# Patient Record
Sex: Female | Born: 1990 | Race: Black or African American | Hispanic: No | Marital: Single | Smoking: Never smoker
Health system: Southern US, Community
[De-identification: ages and names within clinical notes are randomized; demographics above are authoritative.]

## PROBLEM LIST (undated history)

## (undated) DIAGNOSIS — F419 Anxiety disorder, unspecified: Secondary | ICD-10-CM

## (undated) DIAGNOSIS — E669 Obesity, unspecified: Secondary | ICD-10-CM

## (undated) DIAGNOSIS — E282 Polycystic ovarian syndrome: Secondary | ICD-10-CM

## (undated) DIAGNOSIS — I10 Essential (primary) hypertension: Secondary | ICD-10-CM

## (undated) DIAGNOSIS — K219 Gastro-esophageal reflux disease without esophagitis: Secondary | ICD-10-CM

## (undated) DIAGNOSIS — E785 Hyperlipidemia, unspecified: Secondary | ICD-10-CM

## (undated) DIAGNOSIS — D649 Anemia, unspecified: Secondary | ICD-10-CM

## (undated) DIAGNOSIS — D573 Sickle-cell trait: Secondary | ICD-10-CM

## (undated) HISTORY — DX: Anemia, unspecified: D64.9

## (undated) HISTORY — DX: Hyperlipidemia, unspecified: E78.5

## (undated) HISTORY — DX: Anxiety disorder, unspecified: F41.9

## (undated) HISTORY — PX: OTHER SURGICAL HISTORY: SHX169

## (undated) HISTORY — DX: Obesity, unspecified: E66.9

---

## 2017-02-11 ENCOUNTER — Ambulatory Visit (HOSPITAL_COMMUNITY)
Admission: EM | Admit: 2017-02-11 | Discharge: 2017-02-11 | Disposition: A | Discharge status: Home / Self Care | Payer: 59 | Attending: Urgent Care | Admitting: Urgent Care

## 2017-02-11 ENCOUNTER — Encounter (HOSPITAL_COMMUNITY): Payer: Self-pay | Admitting: Emergency Medicine

## 2017-02-11 DIAGNOSIS — R51 Headache: Secondary | ICD-10-CM | POA: Diagnosis not present

## 2017-02-11 DIAGNOSIS — R112 Nausea with vomiting, unspecified: Secondary | ICD-10-CM

## 2017-02-11 DIAGNOSIS — R519 Headache, unspecified: Secondary | ICD-10-CM

## 2017-02-11 DIAGNOSIS — Z9109 Other allergy status, other than to drugs and biological substances: Secondary | ICD-10-CM

## 2017-02-11 MED ORDER — KETOROLAC TROMETHAMINE 60 MG/2ML IM SOLN
INTRAMUSCULAR | Status: AC
  Filled 2017-02-11: qty 2

## 2017-02-11 MED ORDER — KETOROLAC TROMETHAMINE 60 MG/2ML IM SOLN
60.0000 mg | Freq: Once | INTRAMUSCULAR | Status: AC
  Administered 2017-02-11: 60 mg via INTRAMUSCULAR

## 2017-02-11 MED ORDER — ONDANSETRON 8 MG PO TBDP: 8.0000 mg | ORAL_TABLET | Freq: Three times a day (TID) | ORAL | 5 refills | Status: DC | PRN

## 2017-02-11 NOTE — ED Provider Notes (Signed)
  MRN: [id number] DOB: Jun 08, 1990  Subjective:   Christina Bailey is a 26 y.o. female presenting for chief complaint of Headache  Reports 2 week history of intermittent sharp left-sided frontal headache with associated nausea with vomiting (2 episodes), occasional blurred vision. Admits that she feels dizziness when the headaches are severe. Has tried otc ibuprofen, APAP. Also has difficulties with allergies, sinus congestion. Does not take allergy medications. Denies fever, photophobia, phonophobia, confusion, weakness, numbness or tingling. Does not hydrate very well. Denies smoking cigarettes. Has occasional alcohol drink. Sleeps ~6 hours of sleep, feels rested.  No current facility-administered medications for this encounter.    Current Outpatient Prescriptions  Medication Sig Dispense Refill  . acetaminophen (TYLENOL) 325 MG tablet Take 650 mg by mouth every 6 (six) hours as needed.      Christina Bailey has No Known Allergies. Also denies past medical and surgical history.   Objective:   Vitals: BP 140/86 (BP Location: Left Arm)   Pulse 86   Temp 98.4 F (36.9 C) (Oral)   Resp 18   LMP 01/28/2017 (Exact Date)   SpO2 100%   Physical Exam  Constitutional: She is oriented to person, place, and time. She appears well-developed and well-nourished.  HENT:  TM's intact bilaterally, no effusions or erythema. Nasal turbinates dry and erythematous, nasal passages patent. No sinus tenderness. Oropharynx with mild post-nasal drainage, mucous membranes moist.  Eyes: Pupils are equal, round, and reactive to light. EOM are normal. Right eye exhibits no discharge. Left eye exhibits no discharge.  Neck: Normal range of motion. Neck supple.  Cardiovascular: Normal rate, regular rhythm and intact distal pulses.  Exam reveals no gallop and no friction rub.   No murmur heard. Pulmonary/Chest: No respiratory distress. She has no wheezes. She has no rales.  Lymphadenopathy:    She has no cervical  adenopathy.  Neurological: She is alert and oriented to person, place, and time. No cranial nerve deficit. Coordination normal.  Skin: Skin is warm and dry.   Assessment and Plan :   Acute nonintractable headache, unspecified headache type  Environmental allergies  Nausea and vomiting, intractability of vomiting not specified, unspecified vomiting type  Recommended conservative management of headache. Return-to-clinic precautions discussed, patient verbalized understanding.   Wallis BambergMario Demontray Franta, PA-C [facility]  02/11/2017  4:44 PM    Wallis BambergMani, Ricci Paff, PA-C 02/14/17 2115

## 2017-02-11 NOTE — ED Triage Notes (Signed)
[facility] a [facility] headache with nausea x 2 weeks.

## 2017-02-14 ENCOUNTER — Encounter (HOSPITAL_COMMUNITY): Payer: Self-pay | Admitting: Urgent Care

## 2017-03-16 ENCOUNTER — Emergency Department (HOSPITAL_COMMUNITY): Admission: EM | Admit: 2017-03-16 | Discharge: 2017-03-17 | Discharge status: Against Medical Advice | Payer: 59

## 2017-03-16 NOTE — ED Notes (Signed)
Pt called for v/s, no response from lobby 

## 2017-03-16 NOTE — ED Triage Notes (Signed)
No response in lobby for triage.  

## 2017-06-26 ENCOUNTER — Encounter (HOSPITAL_COMMUNITY): Payer: Self-pay | Admitting: Emergency Medicine

## 2017-06-26 ENCOUNTER — Ambulatory Visit (HOSPITAL_COMMUNITY)
Admission: EM | Admit: 2017-06-26 | Discharge: 2017-06-26 | Disposition: A | Discharge status: Home / Self Care | Payer: Managed Care, Other (non HMO) | Attending: Family Medicine | Admitting: Family Medicine

## 2017-06-26 DIAGNOSIS — Z3202 Encounter for pregnancy test, result negative: Secondary | ICD-10-CM

## 2017-06-26 DIAGNOSIS — K529 Noninfective gastroenteritis and colitis, unspecified: Secondary | ICD-10-CM

## 2017-06-26 DIAGNOSIS — R109 Unspecified abdominal pain: Secondary | ICD-10-CM | POA: Diagnosis not present

## 2017-06-26 DIAGNOSIS — K219 Gastro-esophageal reflux disease without esophagitis: Secondary | ICD-10-CM

## 2017-06-26 LAB — POCT URINALYSIS DIP (DEVICE)
Bilirubin Urine: NEGATIVE
GLUCOSE, UA: NEGATIVE mg/dL
Ketones, ur: NEGATIVE mg/dL
LEUKOCYTES UA: NEGATIVE
NITRITE: NEGATIVE
Protein, ur: NEGATIVE mg/dL
Specific Gravity, Urine: 1.025 (ref 1.005–1.030)
UROBILINOGEN UA: 0.2 mg/dL (ref 0.0–1.0)
pH: 6 (ref 5.0–8.0)

## 2017-06-26 LAB — POCT PREGNANCY, URINE: PREG TEST UR: NEGATIVE

## 2017-06-26 MED ORDER — ONDANSETRON 4 MG PO TBDP
4.0000 mg | ORAL_TABLET | Freq: Once | ORAL | Status: AC
  Administered 2017-06-26: 4 mg via ORAL

## 2017-06-26 MED ORDER — ONDANSETRON 4 MG PO TBDP
ORAL_TABLET | ORAL | Status: AC
  Filled 2017-06-26: qty 1

## 2017-06-26 MED ORDER — GI COCKTAIL ~~LOC~~
30.0000 mL | Freq: Once | ORAL | Status: AC
  Administered 2017-06-26: 30 mL via ORAL

## 2017-06-26 MED ORDER — OMEPRAZOLE 20 MG PO CPDR: 20.0000 mg | DELAYED_RELEASE_CAPSULE | Freq: Every day | ORAL | 0 refills | Status: DC

## 2017-06-26 MED ORDER — GI COCKTAIL ~~LOC~~
ORAL | Status: AC
  Filled 2017-06-26: qty 30

## 2017-06-26 MED ORDER — ONDANSETRON HCL 4 MG PO TABS: 4.0000 mg | ORAL_TABLET | Freq: Three times a day (TID) | ORAL | 0 refills | Status: DC | PRN

## 2017-06-26 NOTE — Discharge Instructions (Signed)
Push fluids to ensure adequate hydration and keep secretions thin.  Liquid diet, small frequent sips until pain and nausea have improved. Advance to bland diet as tolerated, toast, crackers, rice etc. Tylenol as needed. Start daily omeprazole. Zofran every 8 hours as needed for nausea. If develop increased pain, vomiting, no urine output in 8 hour time period, dizziness or weakness, or otherwise worsening return to be seen or go to Er. Please establish with a primary care provider.

## 2017-06-26 NOTE — ED Provider Notes (Signed)
[facility]    [name]: [id number] Arrival date & time: 06/26/17  1453     History   Chief Complaint Chief Complaint  Patient presents with  . Abdominal Pain    HPI Christina Bailey is a 27 y.o. female.   Christina Bailey presents with complaints of abdominal pain, nausea, vomiting and diarrhea which started yesterday. She vomited three times yesterday and twice today. States it is green and yellow bile. Today she has had three episodes of diarrhea. She states she has had three days of diarrhea, approximately 3-4 times a day. Feels like she has burning up her throat. Pain is sharp and cramping to middle of her abdomin. States she has not eating or drank fluids today. Decreased urination. No known ill contacts. Denies fevers. No known contaminated or questionable food intake. Denies urinary or vaginal symptoms. History of reflux, does not take medication for this. Denies any other medical history. LMP 1/15.   ROS per HPI.       History reviewed. No pertinent past medical history.  There are no active problems to display for this patient.   History reviewed. No pertinent surgical history.  OB History    No data available       Home Medications    Prior to Admission medications   Medication Sig Start Date End Date Taking? Authorizing Provider  acetaminophen (TYLENOL) 325 MG tablet Take 650 mg by mouth every 6 (six) hours as needed.    [provider]  omeprazole (PRILOSEC) 20 MG capsule Take 1 capsule (20 mg total) by mouth daily. 06/26/17   Georgetta HaberBurky, Corah Willeford B, NP  ondansetron (ZOFRAN) 4 MG tablet Take 1 tablet (4 mg total) by mouth every 8 (eight) hours as needed for nausea or vomiting. 06/26/17   Georgetta HaberBurky, Tavious Griesinger B, NP    Family History No family history on file.  Social History Social History   Tobacco Use  . Smoking status: Never Smoker  . Smokeless tobacco: Never Used  Substance Use Topics  . Alcohol use: Yes  . Drug use: No     Allergies     Patient has no known allergies.   Review of Systems Review of Systems   Physical Exam Triage Vital Signs ED Triage Vitals  Enc Vitals Group     BP 06/26/17 1515 129/75     Pulse Rate 06/26/17 1515 (!) 130     Resp 06/26/17 1515 20     Temp 06/26/17 1515 99.4 F (37.4 C)     Temp Source 06/26/17 1515 Oral     SpO2 06/26/17 1515 100 %     Weight --      Height --      Head Circumference --      Peak Flow --      Pain Score 06/26/17 1516 8     Pain Loc --      Pain Edu? --      Excl. in GC? --    No data found.  Updated Vital Signs BP 129/75 (BP Location: Right Arm)   Pulse (!) 130 Comment: Notified Tina Goss  Temp 99.4 F (37.4 C) (Oral)   Resp 20   LMP 06/19/2017   SpO2 100%   Visual Acuity Right Eye Distance:   Left Eye Distance:   Bilateral Distance:    Right Eye Near:   Left Eye Near:    Bilateral Near:     Physical Exam  Constitutional: She is oriented to person, place, and time.  She appears well-developed and well-nourished. No distress.  HENT:  Head: Normocephalic.  Eyes: Pupils are equal, round, and reactive to light.  Cardiovascular: Regular rhythm and normal heart sounds. Tachycardia present.  Pulmonary/Chest: Effort normal and breath sounds normal.  Abdominal: Soft. There is tenderness in the epigastric area and periumbilical area. There is no rigidity, no rebound, no guarding, no CVA tenderness, no tenderness at McBurney's point and negative Murphy's sign.  Neurological: She is alert and oriented to person, place, and time.  Skin: Skin is warm and dry.     UC Treatments / Results  Labs (all labs ordered are listed, but only abnormal results are displayed) Labs Reviewed  POCT URINALYSIS DIP (DEVICE) - Abnormal; Notable for the following components:      Result Value   Hgb urine dipstick SMALL (*)    All other components within normal limits  POCT PREGNANCY, URINE    EKG  EKG Interpretation None       Radiology No results  found.  Procedures Procedures (including critical care time)  Medications Ordered in UC Medications  ondansetron (ZOFRAN-ODT) disintegrating tablet 4 mg (4 mg Oral Given 06/26/17 1545)  gi cocktail (Maalox,Lidocaine,Donnatal) (30 mLs Oral Given 06/26/17 1545)     Initial Impression / Assessment and Plan / UC Course  I have reviewed the triage vital signs and the nursing notes.  Pertinent labs & imaging results that were available during my care of the patient were reviewed by me and considered in my medical decision making (see chart for details).  Clinical Course as of Jun 26 1634  Tue Jun 26, 2017  1606 Pain 8/10, taking ginger ale  [NB]  1636 Hr 124  [NB]    Clinical Course User Index [NB] Linus Mako B, NP    History and physical exam consistent with gastroenteritis exacerbating gerd symptoms, with vomiting and diarrhea and upper abdominal pain and burning to throat. zofran as needed. Tolerating fluids in clinic. Bland diet as tolerated. Increase fluids. Tachycardia noted at this time, mild improvement after drinking a ginger ale in clinic. Return precautions provided. Patient verbalized understanding and agreeable to plan.    Final Clinical Impressions(s) / UC Diagnoses   Final diagnoses:  Gastroenteritis  Gastroesophageal reflux disease, esophagitis presence not specified    ED Discharge Orders        Ordered    ondansetron (ZOFRAN) 4 MG tablet  Every 8 hours PRN     06/26/17 1619    omeprazole (PRILOSEC) 20 MG capsule  Daily     06/26/17 1619       Controlled Substance Prescriptions [place] Controlled Substance Registry consulted? Not Applicable   Georgetta Haber, NP 06/26/17 1637

## 2017-06-26 NOTE — ED Notes (Signed)
Pt given ginger ale.

## 2017-06-26 NOTE — ED Triage Notes (Signed)
Pt c/o n/v/d since yesterday. Pt c/o abdominal pain generalized.

## 2017-09-08 ENCOUNTER — Emergency Department (HOSPITAL_COMMUNITY)
Admission: EM | Admit: 2017-09-08 | Discharge: 2017-09-08 | Disposition: A | Discharge status: Home / Self Care | Payer: Managed Care, Other (non HMO) | Attending: Emergency Medicine | Admitting: Emergency Medicine

## 2017-09-08 ENCOUNTER — Other Ambulatory Visit: Payer: Self-pay

## 2017-09-08 ENCOUNTER — Encounter (HOSPITAL_COMMUNITY): Payer: Self-pay

## 2017-09-08 DIAGNOSIS — K2100 Gastro-esophageal reflux disease with esophagitis, without bleeding: Secondary | ICD-10-CM

## 2017-09-08 DIAGNOSIS — R1013 Epigastric pain: Secondary | ICD-10-CM | POA: Diagnosis present

## 2017-09-08 HISTORY — DX: Gastro-esophageal reflux disease without esophagitis: K21.9

## 2017-09-08 MED ORDER — OMEPRAZOLE 20 MG PO CPDR: 20.0000 mg | DELAYED_RELEASE_CAPSULE | Freq: Every day | ORAL | 3 refills | Status: DC

## 2017-09-08 MED ORDER — GI COCKTAIL ~~LOC~~
30.0000 mL | Freq: Once | ORAL | Status: AC
  Administered 2017-09-08: 30 mL via ORAL
  Filled 2017-09-08: qty 30

## 2017-09-08 NOTE — ED Triage Notes (Addendum)
Patient reports that she has been noncompliant with acid reflux pills and is now having severe GERD and abdominal pain.

## 2017-09-08 NOTE — ED Notes (Signed)
Bed: [id number] Expected date: 09/08/17 Expected time: 9:19 AM Means of arrival: Ambulance Comments: [facility] VSS

## 2017-09-08 NOTE — ED Provider Notes (Signed)
[facility]-EMERGENCY DEPT Provider Note   [name]: [id number] Arrival date & time: 09/08/17  0856     History   Chief Complaint Chief Complaint  Patient presents with  . Gastroesophageal Reflux  . Abdominal Pain    HPI Christina Bailey is a 27 y.o. female.  Patient is a 27 year old female with a prior history of GERD who presents today with worsening abdominal discomfort, burning chest pain, asked that he taste in the back of her mouth, worse with lying down and with exercise.  She states all of her symptoms have been worse this week.  She was seen in January and was diagnosed with GERD.  At that time she was given Prilosec which she did start taking regularly and finished her course about 2 weeks ago and has not taken anything since that time.  She denies any fever, bowel changes.  She does not drink alcohol excessively does not use tobacco products and is not taking NSAIDs.  She does admit to gaining 70-80 pounds in the last year related to depression and is not sought any counseling for this.  She states she is trying to get back to the gym but when being active it seems to make the reflux worse.  She has stopped eating all spicy foods and is currently only eating 2 times a day.  She tries to not eat large meals at a time.  The history is provided by the patient.  Gastroesophageal Reflux  This is a recurrent problem. Episode onset: ongoing for months but worse in the last 1 week. The problem occurs constantly. The problem has been gradually worsening. Associated symptoms include abdominal pain. Associated symptoms comments: Nausea and vomiting. Exacerbated by: exercise, lying down, spicy foods. Relieved by: sitting up, prilosec.  Abdominal Pain   Her past medical history is significant for GERD.    Past Medical History:  Diagnosis Date  . GERD (gastroesophageal reflux disease)     There are no active problems to display for this patient.   History reviewed. No  pertinent surgical history.   OB History   None      Home Medications    Prior to Admission medications   Medication Sig Start Date End Date Taking? Authorizing Provider  acetaminophen (TYLENOL) 325 MG tablet Take 650 mg by mouth every 6 (six) hours as needed.    [provider]  omeprazole (PRILOSEC) 20 MG capsule Take 1 capsule (20 mg total) by mouth daily. Patient not taking: Reported on 09/08/2017 06/26/17   Linus Mako B, NP  ondansetron (ZOFRAN) 4 MG tablet Take 1 tablet (4 mg total) by mouth every 8 (eight) hours as needed for nausea or vomiting. Patient not taking: Reported on 09/08/2017 06/26/17   Georgetta Haber, NP    Family History History reviewed. No pertinent family history.  Social History Social History   Tobacco Use  . Smoking status: Never Smoker  . Smokeless tobacco: Never Used  Substance Use Topics  . Alcohol use: Yes  . Drug use: No     Allergies   Patient has no known allergies.   Review of Systems Review of Systems  Gastrointestinal: Positive for abdominal pain.  All other systems reviewed and are negative.    Physical Exam Updated Vital Signs BP (!) 170/107 (BP Location: Left Arm)   Pulse 99   Temp 98.4 F (36.9 C) (Oral)   Resp 18   Ht 5\' 5"  (1.651 m)   Wt 123.4 kg (272 lb)  LMP 09/08/2017   SpO2 100%   BMI 45.26 kg/m   Physical Exam  Constitutional: She is oriented to person, place, and time. She appears well-developed and well-nourished. No distress.  obese  HENT:  Head: Normocephalic and atraumatic.  Mouth/Throat: Oropharynx is clear and moist.  Eyes: Pupils are equal, round, and reactive to light. Conjunctivae and EOM are normal.  Neck: Normal range of motion. Neck supple.  Cardiovascular: Normal rate, regular rhythm and intact distal pulses.  No murmur heard. Pulmonary/Chest: Effort normal and breath sounds normal. No respiratory distress. She has no wheezes. She has no rales.  Abdominal: Soft. She exhibits  no distension. There is tenderness in the epigastric area. There is no rebound and no guarding.  Musculoskeletal: Normal range of motion. She exhibits no edema or tenderness.  Neurological: She is alert and oriented to person, place, and time.  Skin: Skin is warm and dry. No rash noted. No erythema.  Psychiatric: She has a normal mood and affect. Her behavior is normal.  Nursing note and vitals reviewed.    ED Treatments / Results  Labs (all labs ordered are listed, but only abnormal results are displayed) Labs Reviewed - No data to display  EKG None  Radiology No results found.  Procedures Procedures (including critical care time)  Medications Ordered in ED Medications  gi cocktail (Maalox,Lidocaine,Donnatal) (has no administration in time range)     Initial Impression / Assessment and Plan / ED Course  I have reviewed the triage vital signs and the nursing notes.  Pertinent labs & imaging results that were available during my care of the patient were reviewed by me and considered in my medical decision making (see chart for details).     Patient is a well-appearing pleasant female presenting with symptoms classic for reflux.  Symptoms had improved with taking Prilosec but she had stopped taking that approximately 2 weeks ago and now her symptoms are much worse.  She has tried some lifestyle changes such as avoiding spicy foods and not eating directly before going to bed.  However discussed with her elevating the head of her bed, eating more frequent and small meals, weight loss and exercise.  Also encouraged her to seek treatment for depression which is most likely worsening her symptoms.  Low suspicion at this time for cholecystitis, cardiac pathology, diabetes, perforated ulcers, obstruction or pancreatitis. Patient given a GI cocktail.  Repeat vital signs improved.   Vitals:   09/08/17 0904 09/08/17 0949  BP: (!) 170/107 (!) 147/100  Pulse: 99 98  Resp: 18 18  Temp:  98.4 F (36.9 C)   SpO2: 100% 100%     Final Clinical Impressions(s) / ED Diagnoses   Final diagnoses:  Gastroesophageal reflux disease with esophagitis    ED Discharge Orders        Ordered    omeprazole (PRILOSEC) 20 MG capsule  Daily     09/08/17 0950       Gwyneth SproutPlunkett, Omarri Eich, MD 09/08/17 1004

## 2017-09-30 ENCOUNTER — Encounter (HOSPITAL_COMMUNITY): Payer: Self-pay | Admitting: *Deleted

## 2017-09-30 ENCOUNTER — Other Ambulatory Visit: Payer: Self-pay

## 2017-09-30 ENCOUNTER — Ambulatory Visit (HOSPITAL_COMMUNITY)
Admission: EM | Admit: 2017-09-30 | Discharge: 2017-09-30 | Disposition: A | Discharge status: Home / Self Care | Payer: Managed Care, Other (non HMO) | Attending: Urgent Care | Admitting: Urgent Care

## 2017-09-30 DIAGNOSIS — G44209 Tension-type headache, unspecified, not intractable: Secondary | ICD-10-CM

## 2017-09-30 DIAGNOSIS — R0789 Other chest pain: Secondary | ICD-10-CM

## 2017-09-30 DIAGNOSIS — J069 Acute upper respiratory infection, unspecified: Secondary | ICD-10-CM

## 2017-09-30 DIAGNOSIS — B9789 Other viral agents as the cause of diseases classified elsewhere: Secondary | ICD-10-CM | POA: Diagnosis not present

## 2017-09-30 HISTORY — DX: Sickle-cell trait: D57.3

## 2017-09-30 MED ORDER — BENZONATATE 100 MG PO CAPS: 100.0000 mg | ORAL_CAPSULE | Freq: Three times a day (TID) | ORAL | 0 refills | Status: DC | PRN

## 2017-09-30 MED ORDER — CETIRIZINE HCL 10 MG PO TABS: 10.0000 mg | ORAL_TABLET | Freq: Every day | ORAL | 11 refills | Status: DC

## 2017-09-30 MED ORDER — HYDROCODONE-HOMATROPINE 5-1.5 MG/5ML PO SYRP: 5.0000 mL | ORAL_SOLUTION | Freq: Every evening | ORAL | 0 refills | Status: DC | PRN

## 2017-09-30 NOTE — ED Triage Notes (Signed)
C/O waking up with "migraine", dizziness, HA, cough.  Decreased appetite.

## 2017-09-30 NOTE — ED Provider Notes (Signed)
  MRN: [id number] DOB: 10-21-1990  Subjective:   Christina Bailey is a 27 y.o. female presenting for acute onset today of temporal headache, cough that elicits chest pain over mid to upper left chest.  Patient overall feels general malaise, decreased appetite, nausea, dizziness.  She has not tried any medications for relief.  Admits a history of allergies.  Denies smoking cigarettes.  Denies history of blood clots.  Denies fever, confusion, visual disturbance, photophobia, numbness or tingling, weakness, shortness of breath, wheezing, belly pain, rashes.  Denies history of migraines.   No current facility-administered medications for this encounter.   Current Outpatient Medications:  .  acetaminophen (TYLENOL) 325 MG tablet, Take 650 mg by mouth every 6 (six) hours as needed., Disp: , Rfl:  .  omeprazole (PRILOSEC) 20 MG capsule, Take 1 capsule (20 mg total) by mouth daily., Disp: 30 capsule, Rfl: 3    No Known Allergies   Past Medical History:  Diagnosis Date  . GERD (gastroesophageal reflux disease)   . Sickle cell trait (HCC)      Denies past surgical history.   Objective:   Vitals: BP 133/80   Pulse 100   Temp 97.9 F (36.6 C) (Oral)   Resp (!) 22   LMP 09/29/2017 (Approximate) Comment: irregular  SpO2 100%   Physical Exam  Constitutional: She is oriented to person, place, and time. She appears well-developed and well-nourished.  HENT:  Mucous membranes dry.  Nasal mucosa dry and inflamed.  TMs clear bilaterally.  Eyes: Pupils are equal, round, and reactive to light. EOM are normal. Right eye exhibits no discharge. Left eye exhibits no discharge. No scleral icterus.  Neck: Normal range of motion. Neck supple.  Cardiovascular: Normal rate, regular rhythm and intact distal pulses. Exam reveals no gallop and no friction rub.  No murmur heard. Pulmonary/Chest: No respiratory distress. She has no wheezes. She has no rales.  Lymphadenopathy:    She has no cervical  adenopathy.  Neurological: She is alert and oriented to person, place, and time. No cranial nerve deficit.  Skin: Skin is warm and dry.  Psychiatric: She has a normal mood and affect.    Assessment and Plan :   Viral URI with cough  Atypical chest pain  Tension headache  We will manage patient's symptoms supportively.  Use Tessalon capsules, Hycodan cough syrup for cough suppression.  Counseled patient on adequate hydration.  Use Zyrtec for allergies. Counseled patient on potential for adverse effects with medications prescribed today, patient verbalized understanding. Return-to-clinic precautions discussed, patient verbalized understanding.    Wallis Bamberg, [place] 09/30/17 1824

## 2017-10-21 ENCOUNTER — Emergency Department (HOSPITAL_COMMUNITY): Payer: Managed Care, Other (non HMO)

## 2017-10-21 ENCOUNTER — Encounter (HOSPITAL_COMMUNITY): Payer: Self-pay | Admitting: Emergency Medicine

## 2017-10-21 ENCOUNTER — Emergency Department (HOSPITAL_COMMUNITY)
Admission: EM | Admit: 2017-10-21 | Discharge: 2017-10-21 | Disposition: A | Discharge status: Home / Self Care | Payer: Managed Care, Other (non HMO) | Attending: Emergency Medicine | Admitting: Emergency Medicine

## 2017-10-21 DIAGNOSIS — Z5321 Procedure and treatment not carried out due to patient leaving prior to being seen by health care provider: Secondary | ICD-10-CM | POA: Diagnosis not present

## 2017-10-21 DIAGNOSIS — R079 Chest pain, unspecified: Secondary | ICD-10-CM | POA: Insufficient documentation

## 2017-10-21 LAB — BASIC METABOLIC PANEL
Anion gap: 10 (ref 5–15)
BUN: 11 mg/dL (ref 6–20)
CALCIUM: 8.8 mg/dL — AB (ref 8.9–10.3)
CO2: 24 mmol/L (ref 22–32)
CREATININE: 0.77 mg/dL (ref 0.44–1.00)
Chloride: 105 mmol/L (ref 101–111)
Glucose, Bld: 128 mg/dL — ABNORMAL HIGH (ref 65–99)
Potassium: 3.7 mmol/L (ref 3.5–5.1)
SODIUM: 139 mmol/L (ref 135–145)

## 2017-10-21 LAB — CBC
HCT: 38.4 % (ref 36.0–46.0)
HEMOGLOBIN: 12 g/dL (ref 12.0–15.0)
MCH: 23 pg — AB (ref 26.0–34.0)
MCHC: 31.3 g/dL (ref 30.0–36.0)
MCV: 73.6 fL — ABNORMAL LOW (ref 78.0–100.0)
Platelets: 419 10*3/uL — ABNORMAL HIGH (ref 150–400)
RBC: 5.22 MIL/uL — ABNORMAL HIGH (ref 3.87–5.11)
RDW: 16.9 % — AB (ref 11.5–15.5)
WBC: 11.9 10*3/uL — AB (ref 4.0–10.5)

## 2017-10-21 LAB — I-STAT BETA HCG BLOOD, ED (MC, WL, AP ONLY): I-stat hCG, quantitative: <5 m[IU]/mL (ref <5)

## 2017-10-21 LAB — I-STAT TROPONIN, ED: TROPONIN I, POC: 0 ng/mL (ref 0.00–0.08)

## 2017-10-21 NOTE — ED Triage Notes (Signed)
Patient here from home with complaints of chest pain. Denies nausea, vomiting.

## 2017-10-21 NOTE — ED Notes (Signed)
Called Pt to be roomed, no response in lobby x2.

## 2017-10-21 NOTE — ED Notes (Signed)
Called Pt to be roomed, no response in lobby x1. 

## 2017-10-21 NOTE — ED Notes (Signed)
No answer from lobby  

## 2017-10-22 NOTE — ED Notes (Signed)
10/22/17    1140   Attempted follow up call  Number on chart is not for this pt   s Laelani Vasko rn

## 2017-10-27 ENCOUNTER — Emergency Department (HOSPITAL_COMMUNITY)
Admission: EM | Admit: 2017-10-27 | Discharge: 2017-10-27 | Disposition: A | Discharge status: Home / Self Care | Payer: Managed Care, Other (non HMO) | Attending: Emergency Medicine | Admitting: Emergency Medicine

## 2017-10-27 ENCOUNTER — Other Ambulatory Visit: Payer: Self-pay

## 2017-10-27 ENCOUNTER — Encounter (HOSPITAL_COMMUNITY): Payer: Self-pay

## 2017-10-27 ENCOUNTER — Emergency Department (HOSPITAL_COMMUNITY): Payer: Managed Care, Other (non HMO)

## 2017-10-27 DIAGNOSIS — Z79899 Other long term (current) drug therapy: Secondary | ICD-10-CM | POA: Diagnosis not present

## 2017-10-27 DIAGNOSIS — R0789 Other chest pain: Secondary | ICD-10-CM | POA: Diagnosis not present

## 2017-10-27 DIAGNOSIS — D573 Sickle-cell trait: Secondary | ICD-10-CM | POA: Diagnosis not present

## 2017-10-27 LAB — CBC WITH DIFFERENTIAL/PLATELET
BASOS PCT: 0 %
Basophils Absolute: 0 10*3/uL (ref 0.0–0.1)
EOS ABS: 0.2 10*3/uL (ref 0.0–0.7)
Eosinophils Relative: 1 %
HEMATOCRIT: 37.2 % (ref 36.0–46.0)
HEMOGLOBIN: 11.6 g/dL — AB (ref 12.0–15.0)
Lymphocytes Relative: 36 %
Lymphs Abs: 4.2 10*3/uL — ABNORMAL HIGH (ref 0.7–4.0)
MCH: 22.9 pg — ABNORMAL LOW (ref 26.0–34.0)
MCHC: 31.2 g/dL (ref 30.0–36.0)
MCV: 73.4 fL — ABNORMAL LOW (ref 78.0–100.0)
Monocytes Absolute: 0.5 10*3/uL (ref 0.1–1.0)
Monocytes Relative: 4 %
NEUTROS ABS: 6.8 10*3/uL (ref 1.7–7.7)
NEUTROS PCT: 59 %
Platelets: 391 10*3/uL (ref 150–400)
RBC: 5.07 MIL/uL (ref 3.87–5.11)
RDW: 16.9 % — ABNORMAL HIGH (ref 11.5–15.5)
WBC: 11.6 10*3/uL — AB (ref 4.0–10.5)

## 2017-10-27 LAB — I-STAT CHEM 8, ED
BUN: 7 mg/dL (ref 6–20)
Calcium, Ion: 1.08 mmol/L — ABNORMAL LOW (ref 1.15–1.40)
Chloride: 102 mmol/L (ref 101–111)
Creatinine, Ser: 0.8 mg/dL (ref 0.44–1.00)
Glucose, Bld: 125 mg/dL — ABNORMAL HIGH (ref 65–99)
HEMATOCRIT: 38 % (ref 36.0–46.0)
HEMOGLOBIN: 12.9 g/dL (ref 12.0–15.0)
POTASSIUM: 3.5 mmol/L (ref 3.5–5.1)
Sodium: 139 mmol/L (ref 135–145)
TCO2: 25 mmol/L (ref 22–32)

## 2017-10-27 LAB — I-STAT BETA HCG BLOOD, ED (MC, WL, AP ONLY)

## 2017-10-27 LAB — D-DIMER, QUANTITATIVE (NOT AT ARMC)

## 2017-10-27 MED ORDER — MELOXICAM 15 MG PO TABS: 15.0000 mg | ORAL_TABLET | Freq: Every day | ORAL | 0 refills | Status: DC

## 2017-10-27 NOTE — Discharge Instructions (Addendum)
Start taking mobic as prescribed for pain and inflammation for at least a week. No strenuous activity. Follow up with family doctor as needed.

## 2017-10-27 NOTE — ED Notes (Signed)
TWO UNSUCCESSFUL LAB COLLECTION ATTEMPTS 

## 2017-10-27 NOTE — ED Provider Notes (Signed)
[facility]-EMERGENCY DEPT Provider Note   [name]: [id number] Arrival date & time: 10/27/17  4098     History   Chief Complaint Chief Complaint  Patient presents with  . Chest Pain    HPI Christina Bailey is a 27 y.o. female.  HPI Christina Bailey is a 27 y.o. female history of acid reflux, presents to emergency department complaining of chest pain.  Patient states she has had sharp pain in her left side of the chest for almost a month.  She says she went to her primary care doctor and was told she had upper respiratory tract infection.  She states pain has not improved.  It is worse with movement and taking deep breaths.  She has had some shortness of breath but none at this time.  She denies any cough or congestion.  She has not tried any medications for this prior to coming in.  She denies any pain or swelling in extremities.  She does not smoke.  She does not take any birth control.  She denies being pregnant.  She has not had any recent travel or surgeries.  She states her brother has WPW.  She reports no prior cardiac or lung issues.  Past Medical History:  Diagnosis Date  . GERD (gastroesophageal reflux disease)   . Sickle cell trait (HCC)     There are no active problems to display for this patient.   No past surgical history on file.   OB History   None      Home Medications    Prior to Admission medications   Medication Sig Start Date End Date Taking? Authorizing Provider  acetaminophen (TYLENOL) 325 MG tablet Take 650 mg by mouth every 6 (six) hours as needed for moderate pain.     [provider]  benzonatate (TESSALON) 100 MG capsule Take 1-2 capsules (100-200 mg total) by mouth 3 (three) times daily as needed. Patient not taking: Reported on 10/21/2017 09/30/17   Wallis Bamberg, PA-C  cetirizine (ZYRTEC ALLERGY) 10 MG tablet Take 1 tablet (10 mg total) by mouth daily. Patient taking differently: Take 10 mg by mouth daily as needed for  allergies.  09/30/17   Wallis Bamberg, PA-C  HYDROcodone-homatropine [facility]) 5-1.5 MG/5ML syrup Take 5 mLs by mouth at bedtime as needed. Patient not taking: Reported on 10/21/2017 09/30/17   Wallis Bamberg, PA-C  omeprazole (PRILOSEC) 20 MG capsule Take 1 capsule (20 mg total) by mouth daily. 09/08/17   Gwyneth Sprout, MD    Family History Family History  Problem Relation Age of Onset  . Healthy Mother   . Sickle cell anemia Father     Social History Social History   Tobacco Use  . Smoking status: Never Smoker  . Smokeless tobacco: Never Used  Substance Use Topics  . Alcohol use: Not Currently  . Drug use: No     Allergies   Patient has no known allergies.   Review of Systems Review of Systems  Constitutional: Negative for chills and fever.  Respiratory: Positive for chest tightness and shortness of breath. Negative for cough.   Cardiovascular: Positive for chest pain. Negative for palpitations and leg swelling.  Gastrointestinal: Negative for abdominal pain, diarrhea, nausea and vomiting.  Genitourinary: Negative for dysuria, flank pain, pelvic pain, vaginal bleeding, vaginal discharge and vaginal pain.  Musculoskeletal: Negative for arthralgias, myalgias, neck pain and neck stiffness.  Skin: Negative for rash.  Neurological: Negative for dizziness, weakness and headaches.  All other systems reviewed and  are negative.    Physical Exam Updated Vital Signs BP (!) 150/85 (BP Location: Left Arm)   Pulse 80   Temp 97.9 F (36.6 C) (Oral)   Resp 18   LMP 09/29/2017 (Approximate) Comment: irregular  SpO2 99%   Physical Exam  Constitutional: She appears well-developed and well-nourished. No distress.  HENT:  Head: Normocephalic.  Eyes: Conjunctivae are normal.  Neck: Neck supple.  Cardiovascular: Normal rate, regular rhythm and normal heart sounds.  Pulmonary/Chest: Effort normal and breath sounds normal. No respiratory distress. She has no wheezes. She has no rales.  She exhibits no tenderness.  Abdominal: Soft. Bowel sounds are normal. She exhibits no distension. There is no tenderness. There is no rebound.  Musculoskeletal: She exhibits no edema.  Neurological: She is alert.  Skin: Skin is warm and dry.  Psychiatric: She has a normal mood and affect. Her behavior is normal.  Nursing note and vitals reviewed.    ED Treatments / Results  Labs (all labs ordered are listed, but only abnormal results are displayed) Labs Reviewed  CBC WITH DIFFERENTIAL/PLATELET - Abnormal; Notable for the following components:      Result Value   WBC 11.6 (*)    Hemoglobin 11.6 (*)    MCV 73.4 (*)    MCH 22.9 (*)    RDW 16.9 (*)    Lymphs Abs 4.2 (*)    All other components within normal limits  I-STAT CHEM 8, ED - Abnormal; Notable for the following components:   Glucose, Bld 125 (*)    Calcium, Ion 1.08 (*)    All other components within normal limits  D-DIMER, QUANTITATIVE (NOT AT [facility])  I-STAT BETA HCG BLOOD, ED ([facility], [facility], [facility] ONLY)    EKG EKG Interpretation  Date/Time:  Saturday Oct 27 2017 07:13:53 EDT Ventricular Rate:  88 PR Interval:    QRS Duration: 85 QT Interval:  383 QTC Calculation: 464 R Axis:   63 Text Interpretation:  Sinus rhythm Abnormal Q suggests inferior infarct Borderline repolarization abnormality Baseline wander in lead(s) II III aVF V5 No significant change since last tracing Confirmed by Doug Sou [phone]) on 10/27/2017 7:16:38 AM   Radiology Dg Chest 2 View  Result Date: 10/27/2017 CLINICAL DATA:  Chest pain. EXAM: CHEST - 2 VIEW COMPARISON:  Radiographs of Oct 21, 2017. FINDINGS: The heart size and mediastinal contours are within normal limits. Both lungs are clear. No pneumothorax or pleural effusion is noted. The visualized skeletal structures are unremarkable. IMPRESSION: No active cardiopulmonary disease. Electronically Signed   By: Lupita Raider, M.D.   On: 10/27/2017 08:36    Procedures Procedures (including  critical care time)  Medications Ordered in ED Medications - No data to display   Initial Impression / Assessment and Plan / ED Course  I have reviewed the triage vital signs and the nursing notes.  Pertinent labs & imaging results that were available during my care of the patient were reviewed by me and considered in my medical decision making (see chart for details).     Patient with somewhat pleuritic chest pain, ongoing for a month.  Her vital signs are normal.  She has no other explanation for her chest pain at this time, will get a d-dimer to rule out PE.  I cannot reproduce her pain with palpation of the chest wall.  Will also get a chest x-ray and basic labs.  Patient does not appear to be in any distress, she is nontoxic.  10:42 AM Patient's  labs are unremarkable other than slightly low hemoglobin 11.6, and slightly linear blood cell count of 11.6.  I do not think these findings are contributing to her chest pain.  Her d-dimer is negative.  She is not pregnant.  Chest x-ray is normal.  Most likely musculoskeletal pain.  Possibly pericarditis, although no ECG changes. Discussed results with patient, will start on NSAIDs.  Advised no strenuous lifting with upper body.  We will have her follow-up with family doctor.  Patient agreed to the plan.  Stable for discharge home.  Vitals:   10/27/17 1032 10/27/17 1035 10/27/17 1040 10/27/17 1046  BP:    (!) 143/81  Pulse:  81  68  Resp: (!) 26 (!) 22 20 (!) 23  Temp:  98.9 F (37.2 C)    TempSrc:  Oral    SpO2:  100%         Final Clinical Impressions(s) / ED Diagnoses   Final diagnoses:  Atypical chest pain    ED Discharge Orders        Ordered    meloxicam (MOBIC) 15 MG tablet  Daily     10/27/17 1045       Jaynie Crumble, PA-C 10/27/17 1516    Doug Sou, MD 10/27/17 1656

## 2017-10-27 NOTE — ED Triage Notes (Signed)
She reports having intermittent chest "shooting pains" which are worse at times depending on activity/positioning; x just over a week. She is in no distress. She states she was here a week ago "but I didn't have time to stay".

## 2018-07-18 ENCOUNTER — Encounter (HOSPITAL_COMMUNITY): Payer: Self-pay | Admitting: *Deleted

## 2018-07-18 ENCOUNTER — Other Ambulatory Visit: Payer: Self-pay

## 2018-07-18 ENCOUNTER — Emergency Department (HOSPITAL_COMMUNITY): Payer: Managed Care, Other (non HMO)

## 2018-07-18 DIAGNOSIS — R0789 Other chest pain: Secondary | ICD-10-CM | POA: Insufficient documentation

## 2018-07-18 DIAGNOSIS — M25512 Pain in left shoulder: Secondary | ICD-10-CM | POA: Insufficient documentation

## 2018-07-18 LAB — CBC
HCT: 40.7 % (ref 36.0–46.0)
HEMOGLOBIN: 12.2 g/dL (ref 12.0–15.0)
MCH: 21.9 pg — AB (ref 26.0–34.0)
MCHC: 30 g/dL (ref 30.0–36.0)
MCV: 73.2 fL — AB (ref 80.0–100.0)
Platelets: 434 10*3/uL — ABNORMAL HIGH (ref 150–400)
RBC: 5.56 MIL/uL — ABNORMAL HIGH (ref 3.87–5.11)
RDW: 18.6 % — ABNORMAL HIGH (ref 11.5–15.5)
WBC: 14 10*3/uL — ABNORMAL HIGH (ref 4.0–10.5)
nRBC: 0 % (ref 0.0–0.2)

## 2018-07-18 LAB — I-STAT BETA HCG BLOOD, ED (NOT ORDERABLE): I-stat hCG, quantitative: <5 m[IU]/mL (ref <5)

## 2018-07-18 MED ORDER — SODIUM CHLORIDE 0.9% FLUSH: 3.0000 mL | Freq: Once | INTRAVENOUS | Status: DC

## 2018-07-18 NOTE — ED Triage Notes (Addendum)
Pt c/o left chest pain x 1 week stating "It also hurts in my left shoulder.  I think I pulled something @ work.  I lift over my head."  Pt denies n/v or SOB.

## 2018-07-18 NOTE — ED Notes (Signed)
EKG given to EDP,Pollina,MD., for review. 

## 2018-07-19 ENCOUNTER — Emergency Department (HOSPITAL_COMMUNITY)
Admission: EM | Admit: 2018-07-19 | Discharge: 2018-07-19 | Disposition: A | Discharge status: Home / Self Care | Payer: Managed Care, Other (non HMO) | Attending: Emergency Medicine | Admitting: Emergency Medicine

## 2018-07-19 DIAGNOSIS — M25512 Pain in left shoulder: Secondary | ICD-10-CM

## 2018-07-19 DIAGNOSIS — R079 Chest pain, unspecified: Secondary | ICD-10-CM

## 2018-07-19 LAB — BASIC METABOLIC PANEL
ANION GAP: 9 (ref 5–15)
BUN: 11 mg/dL (ref 6–20)
CO2: 25 mmol/L (ref 22–32)
Calcium: 8.5 mg/dL — ABNORMAL LOW (ref 8.9–10.3)
Chloride: 103 mmol/L (ref 98–111)
Creatinine, Ser: 0.79 mg/dL (ref 0.44–1.00)
GFR calc Af Amer: >60 mL/min (ref >60)
GLUCOSE: 117 mg/dL — AB (ref 70–99)
Potassium: 3.6 mmol/L (ref 3.5–5.1)
Sodium: 137 mmol/L (ref 135–145)

## 2018-07-19 LAB — POCT I-STAT TROPONIN I: Troponin i, poc: 0 ng/mL (ref 0.00–0.08)

## 2018-07-19 MED ORDER — OMEPRAZOLE 20 MG PO CPDR: 20.0000 mg | DELAYED_RELEASE_CAPSULE | Freq: Every day | ORAL | 0 refills | Status: DC

## 2018-07-19 MED ORDER — MELOXICAM 15 MG PO TABS: 15.0000 mg | ORAL_TABLET | Freq: Every day | ORAL | 0 refills | Status: DC

## 2018-07-19 NOTE — Discharge Instructions (Addendum)
1. Medications: Mobic daily for inflammation, Omeprazole if needed for recurrent GERD, usual home medications 2. Treatment: rest, ice, elevate and use brace, drink plenty of fluids, gentle stretching 3. Follow Up: Please followup with orthopedics as directed or your PCP in 1 week if no improvement for discussion of your diagnoses and further evaluation after today's visit; if you do not have a primary care doctor use the resource guide provided to find one; Please return to the ER for worsening symptoms or other concerns

## 2018-07-19 NOTE — ED Notes (Signed)
Bed: XB93 Expected date:  Expected time:  Means of arrival:  Comments: [place]

## 2018-07-19 NOTE — ED Provider Notes (Signed)
[facility]-EMERGENCY DEPT Provider Note   [name]: [id number] Arrival date & time: 07/18/18  2245     History   Chief Complaint Chief Complaint  Patient presents with  . Chest Pain    left    HPI Christina Bailey is a 28 y.o. female with a hx of GERD, sickle cell trait presents to the Emergency Department complaining of intermittent left shoulder pain onset over 1 year ago, with recurrent episode onset approx 1 week ago.  Pt reports she lifts heavy items at work, often over her head.  She reports movement and palpation make her symptoms worse and nothing makes them better. Pt reports no treatments PTA.  She denies numbness, tingling and weakness in her left arm. She is right handed. Pt states she believes she pulled a muscle, but when she googled her symptoms she became concerned for a heart attack. Pt denies family hx of early cardiac death.  She reports some family hx of breast cancer, but has not noticed any breast masses (though she admits that she does not do routine breast exams).  Pt denies pain with exertion or associated shortness of breath, diaphoresis or nausea.  No recent illness or fevers.  Pt denies IVDU, known trauma, recent travel, pleuritic pain, hx of DVT, leg swelling or estrogen usage.     The history is provided by the patient and medical records. No language interpreter was used.    Past Medical History:  Diagnosis Date  . GERD (gastroesophageal reflux disease)   . Sickle cell trait (HCC)     There are no active problems to display for this patient.   History reviewed. No pertinent surgical history.   OB History   No obstetric history on file.      Home Medications    Prior to Admission medications   Medication Sig Start Date End Date Taking? Authorizing Provider  meloxicam (MOBIC) 15 MG tablet Take 1 tablet (15 mg total) by mouth daily. 07/19/18   Tanieka Pownall, Dahlia Client, PA-C  omeprazole (PRILOSEC) 20 MG capsule Take 1 capsule (20 mg  total) by mouth daily. 07/19/18   Trayven Lumadue, Dahlia Client, PA-C    Family History Family History  Problem Relation Age of Onset  . Healthy Mother   . Sickle cell anemia Father     Social History Social History   Tobacco Use  . Smoking status: Never Smoker  . Smokeless tobacco: Never Used  Substance Use Topics  . Alcohol use: Not Currently  . Drug use: No     Allergies   Patient has no known allergies.   Review of Systems Review of Systems  Constitutional: Negative for appetite change, diaphoresis, fatigue, fever and unexpected weight change.  HENT: Negative for mouth sores.   Eyes: Negative for visual disturbance.  Respiratory: Negative for cough, chest tightness, shortness of breath and wheezing.   Cardiovascular: Positive for chest pain.  Gastrointestinal: Negative for abdominal pain, constipation, diarrhea, nausea and vomiting.  Endocrine: Negative for polydipsia, polyphagia and polyuria.  Genitourinary: Negative for dysuria, frequency, hematuria and urgency.  Musculoskeletal: Positive for arthralgias. Negative for back pain and neck stiffness.  Skin: Negative for rash.  Allergic/Immunologic: Negative for immunocompromised state.  Neurological: Negative for syncope, light-headedness and headaches.  Hematological: Does not bruise/bleed easily.  Psychiatric/Behavioral: Negative for sleep disturbance. The patient is not nervous/anxious.      Physical Exam Updated Vital Signs BP (!) 144/109 (BP Location: Left Arm)   Pulse 92   Temp 98.8 F (37.1 C) (  Oral)   Resp 16   Ht 5\' 5"  (1.651 m)   Wt 127 kg   LMP 07/10/2018 (Approximate)   SpO2 100%   BMI 46.59 kg/m   Physical Exam Vitals signs and nursing note reviewed.  Constitutional:      General: She is not in acute distress.    Appearance: She is well-developed. She is not diaphoretic.     Comments: Awake, alert, nontoxic appearance  HENT:     Head: Normocephalic and atraumatic.     Mouth/Throat:      Pharynx: No oropharyngeal exudate.  Eyes:     General: No scleral icterus.    Conjunctiva/sclera: Conjunctivae normal.  Neck:     Musculoskeletal: Normal range of motion and neck supple.  Cardiovascular:     Rate and Rhythm: Normal rate and regular rhythm.  Pulmonary:     Effort: Pulmonary effort is normal. No respiratory distress.     Breath sounds: Normal breath sounds. No wheezing.  Chest:       Comments: Pt declines breast exam today Abdominal:     General: Bowel sounds are normal.     Palpations: Abdomen is soft. There is no mass.     Tenderness: There is no abdominal tenderness. There is no guarding or rebound.  Musculoskeletal: Normal range of motion.     Left shoulder: She exhibits tenderness (anterior shoulder, no joint line tenderness). She exhibits normal range of motion, no bony tenderness, no swelling, no deformity, no laceration, no spasm and normal strength.       Arms:  Lymphadenopathy:     Upper Body:     Left upper body: No supraclavicular, axillary or pectoral adenopathy.  Skin:    General: Skin is warm and dry.  Neurological:     Mental Status: She is alert.     Comments: Speech is clear and goal oriented Moves extremities without ataxia      ED Treatments / Results  Labs (all labs ordered are listed, but only abnormal results are displayed) Labs Reviewed  BASIC METABOLIC PANEL - Abnormal; Notable for the following components:      Result Value   Glucose, Bld 117 (*)    Calcium 8.5 (*)    All other components within normal limits  CBC - Abnormal; Notable for the following components:   WBC 14.0 (*)    RBC 5.56 (*)    MCV 73.2 (*)    MCH 21.9 (*)    RDW 18.6 (*)    Platelets 434 (*)    All other components within normal limits  I-STAT BETA HCG BLOOD, ED ([facility], [facility], [facility] ONLY)  I-STAT BETA HCG BLOOD, ED (NOT ORDERABLE)  I-STAT TROPONIN, ED  POCT I-STAT TROPONIN I    EKG EKG Interpretation  Date/Time:  Thursday July 18 2018 23:28:20  EST Ventricular Rate:  88 PR Interval:    QRS Duration: 97 QT Interval:  370 QTC Calculation: 448 R Axis:   35 Text Interpretation:  Sinus rhythm Normal ECG Confirmed by Gilda Crease [phone]) on 07/18/2018 11:52:07 PM   Radiology Dg Chest 2 View  Result Date: 07/18/2018 CLINICAL DATA:  Left-sided chest pain for a week EXAM: CHEST - 2 VIEW COMPARISON:  10/27/2017 FINDINGS: The heart size and mediastinal contours are within normal limits. Both lungs are clear. The visualized skeletal structures are unremarkable. IMPRESSION: No active cardiopulmonary disease. Electronically Signed   By: Tollie Eth M.D.   On: 07/18/2018 23:56    Procedures Procedures (including  critical care time)  Medications Ordered in ED Medications  sodium chloride flush (NS) 0.9 % injection 3 mL (has no administration in time range)     Initial Impression / Assessment and Plan / ED Course  I have reviewed the triage vital signs and the nursing notes.  Pertinent labs & imaging results that were available during my care of the patient were reviewed by me and considered in my medical decision making (see chart for details).  Clinical Course as of Jul 19 345  Fri Jul 19, 2018  0246 Leukocytosis noted; patient denies steroid usage.  Discussed finding with patient.  She will need close primary care follow-up for this, however it is nonspecific as she has no infectious symptoms.  WBC(!): 14.0 [HM]  0344 Troponin negative.  No clinical evidence of myocarditis.  Troponin i, poc: 0.00 [HM]  0345 Patient initially hypertensive on arrival.  This has improved while here in the emergency department.  BP(!): 136/100 [HM]  0345 Chest x-ray without evidence of pneumonia, pulmonary edema, pneumothorax, widened mediastinum, cardiomegaly, Boerhaave's.  DG Chest 2 View [HM]    Clinical Course User Index [HM] Dorrine Montone, Dahlia ClientHannah, PA-C    Patient presents to the emergency department with anterior shoulder and upper  left chest pain. PERC negative.  No lymphadenopathy to suggest breast etiology.  No joint line tenderness.  Full range of motion of the left shoulder.  Highly doubt septic joint.  Patient does have leukocytosis but is afebrile and denies any recent viral illness.  Troponin is negative, no clinical evidence of myocarditis.  EKG is without ischemia.  No evidence of acute coronary syndrome.  Chest x-ray is unremarkable.  Patient's pain is likely secondary to muscle strain due to her heavy lifting.  I have recommended anti-inflammatories and gentle stretching.  She is to follow-up with primary care or orthopedics if symptoms persist.  Additionally, patient is to follow with primary care for further evaluation of her leukocytosis.  Discussed reasons to return immediately to the emergency department.  Patient states understanding and is in agreement with the plan.  BP (!) 136/100   Pulse 83   Temp 98.8 F (37.1 C) (Oral)   Resp 18   Ht 5\' 5"  (1.651 m)   Wt 127 kg   LMP 07/10/2018 (Approximate)   SpO2 100%   BMI 46.59 kg/m    Final Clinical Impressions(s) / ED Diagnoses   Final diagnoses:  Left-sided chest pain  Acute pain of left shoulder    ED Discharge Orders         Ordered    meloxicam (MOBIC) 15 MG tablet  Daily     07/19/18 0335    omeprazole (PRILOSEC) 20 MG capsule  Daily     07/19/18 0335           Lonnette Shrode, Dahlia ClientHannah, PA-C 07/19/18 0347    Gilda CreasePollina, Christopher J, MD 07/19/18 [id number]

## 2019-07-06 IMAGING — CR DG CHEST 2V
2 series · 2 of 2 positions shown · non-contrast
Comparison: None.

CLINICAL DATA: Patient here from home with complaints of chest
pain. Denies nausea, vomiting. hx of sickle cell

EXAM:
CHEST - 2 VIEW

[w chest pa]
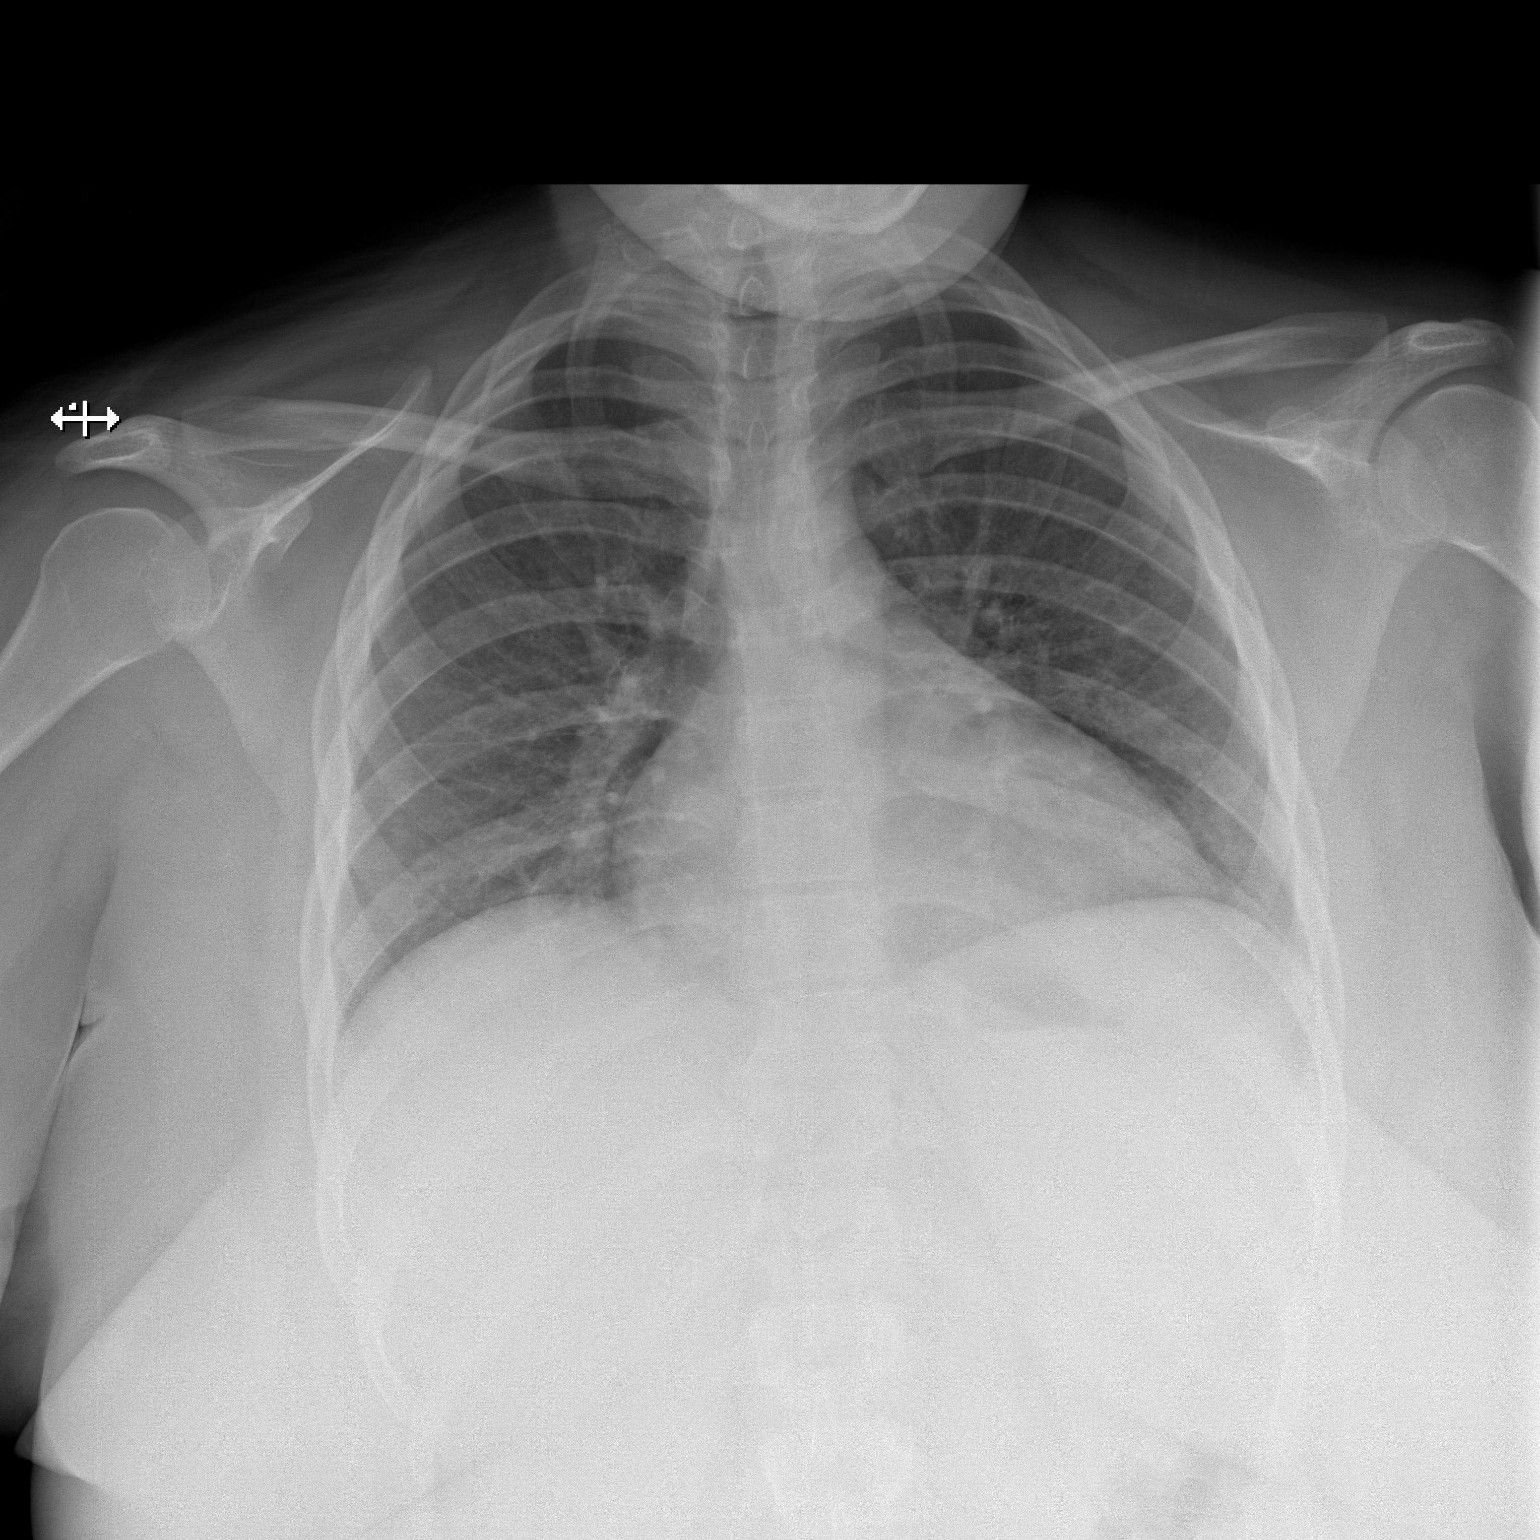

[w chest lat]
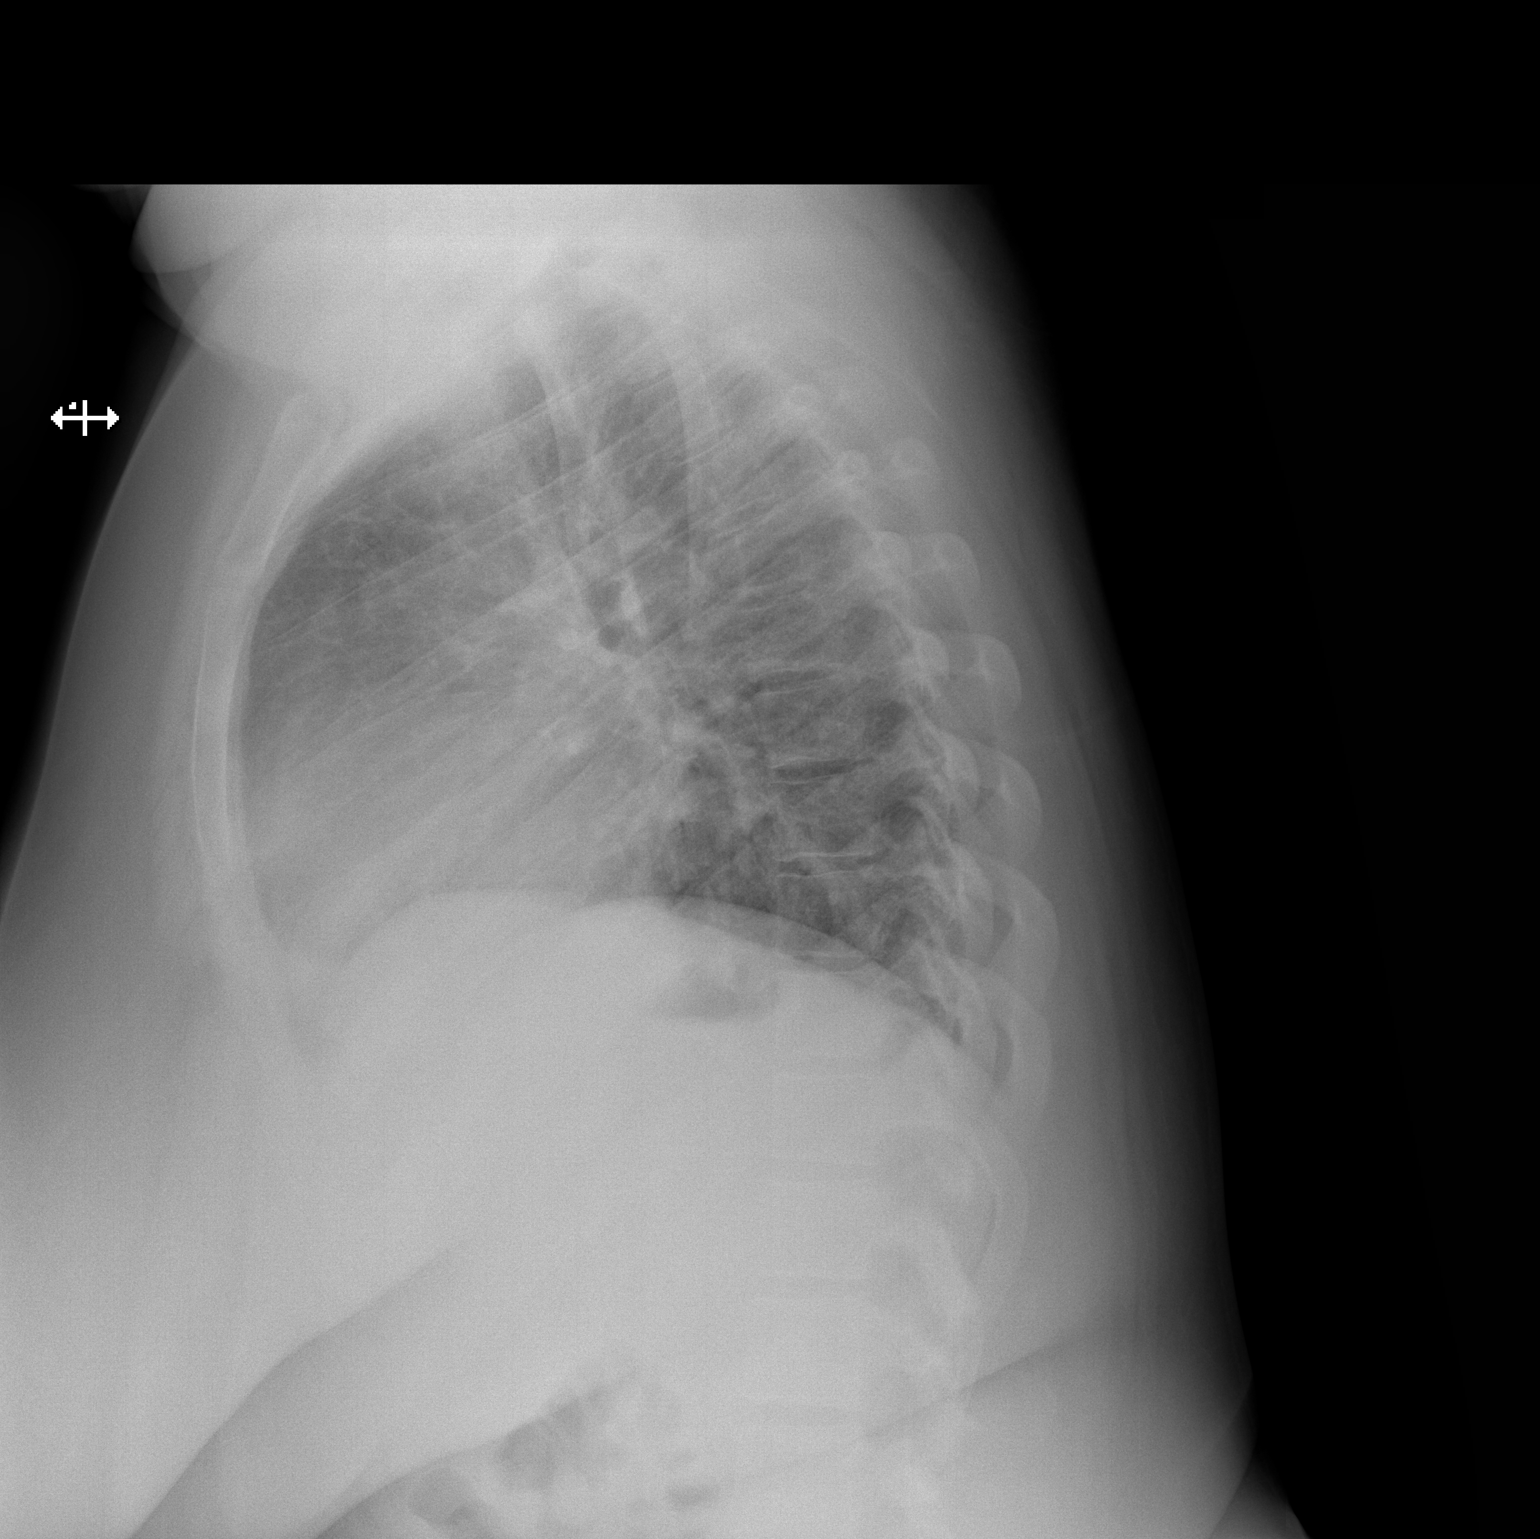

[2 of 2 positions shown; findings below may reference images not displayed]

FINDINGS: Normal mediastinum and cardiac silhouette. Normal pulmonary
vasculature. No evidence of effusion, infiltrate, or pneumothorax.
No acute bony abnormality.
IMPRESSION: Normal chest radiograph

## 2020-01-08 ENCOUNTER — Ambulatory Visit (HOSPITAL_COMMUNITY): Payer: Self-pay

## 2020-04-30 ENCOUNTER — Encounter: Payer: Self-pay | Admitting: Emergency Medicine

## 2020-04-30 ENCOUNTER — Other Ambulatory Visit: Payer: Self-pay

## 2020-04-30 ENCOUNTER — Ambulatory Visit
Admission: EM | Admit: 2020-04-30 | Discharge: 2020-04-30 | Disposition: A | Discharge status: Home / Self Care | Payer: Medicaid Other | Attending: Emergency Medicine | Admitting: Emergency Medicine

## 2020-04-30 DIAGNOSIS — G43109 Migraine with aura, not intractable, without status migrainosus: Secondary | ICD-10-CM

## 2020-04-30 HISTORY — DX: Polycystic ovarian syndrome: E28.2

## 2020-04-30 MED ORDER — KETOROLAC TROMETHAMINE 30 MG/ML IJ SOLN
30.0000 mg | Freq: Once | INTRAMUSCULAR | Status: AC
  Administered 2020-04-30: 30 mg via INTRAMUSCULAR

## 2020-04-30 MED ORDER — DEXAMETHASONE SODIUM PHOSPHATE 10 MG/ML IJ SOLN
10.0000 mg | Freq: Once | INTRAMUSCULAR | Status: AC
  Administered 2020-04-30: 10 mg via INTRAMUSCULAR

## 2020-04-30 MED ORDER — ONDANSETRON 4 MG PO TBDP
4.0000 mg | ORAL_TABLET | Freq: Once | ORAL | Status: AC
  Administered 2020-04-30: 4 mg via ORAL

## 2020-04-30 NOTE — ED Triage Notes (Signed)
Pt here with left frontal HA that started yesterday; pt sts hx of migraine in past

## 2020-04-30 NOTE — ED Provider Notes (Signed)
[name]    [name]: [id number] Arrival date & time: 04/30/20  1234      History   Chief Complaint Chief Complaint  Patient presents with  . Headache    HPI Christina Bailey is a 29 y.o. female  Patient presents for evaluation of headache. Symptoms began about 1 day ago. Generally, the headaches last about a few hours and occur a few times a year. The headaches do not seem to be related to any time of day or year. The headaches are usually poorly described and are located in front. Recently, the headaches have been stable. Work attendance or other daily activities are affected by the headaches. Precipitating factors include: none which have been determined. The headaches are usually preceded by an aura consisting of blurry vision. Associated neurologic symptoms: none. The patient denies dizziness, loss of balance, muscle weakness, numbness of extremities, speech difficulties, vision problems and vomiting in the early morning. Home treatment has included acetaminophen with some improvement. Other history includes: migraine headaches diagnosed in the past. Family history includes no known family members with significant headaches.      Past Medical History:  Diagnosis Date  . GERD (gastroesophageal reflux disease)   . PCOS (polycystic ovarian syndrome)   . Sickle cell trait (HCC)     There are no problems to display for this patient.   History reviewed. No pertinent surgical history.  OB History   No obstetric history on file.      Home Medications    Prior to Admission medications   Medication Sig Start Date End Date Taking? Authorizing Provider  meloxicam (MOBIC) 15 MG tablet Take 1 tablet (15 mg total) by mouth daily. Patient not taking: Reported on 04/30/2020 07/19/18   Muthersbaugh, Dahlia Client, PA-C  omeprazole (PRILOSEC) 20 MG capsule Take 1 capsule (20 mg total) by mouth daily. Patient not taking: Reported on 04/30/2020 07/19/18   Muthersbaugh, Dahlia Client,  PA-C    Family History Family History  Problem Relation Age of Onset  . Healthy Mother   . Sickle cell anemia Father     Social History Social History   Tobacco Use  . Smoking status: Never Smoker  . Smokeless tobacco: Never Used  Vaping Use  . Vaping Use: Never used  Substance Use Topics  . Alcohol use: Not Currently  . Drug use: No     Allergies   Patient has no known allergies.   Review of Systems Review of Systems  Constitutional: Negative for fatigue and fever.  HENT: Negative for ear pain, sinus pain, sore throat and voice change.   Eyes: Negative for pain, redness and visual disturbance.  Respiratory: Negative for cough and shortness of breath.   Cardiovascular: Negative for chest pain and palpitations.  Gastrointestinal: Negative for abdominal pain, diarrhea and vomiting.  Musculoskeletal: Negative for arthralgias and myalgias.  Skin: Negative for rash and wound.  Neurological: Positive for headaches. Negative for dizziness, tremors, seizures, syncope, facial asymmetry, speech difficulty, weakness, light-headedness and numbness.     Physical Exam Triage Vital Signs ED Triage Vitals [04/30/20 1255]  Enc Vitals Group     BP (!) 150/92     Pulse Rate 92     Resp 18     Temp 97.9 F (36.6 C)     Temp Source Oral     SpO2 95 %     Weight      Height      Head Circumference      Peak Flow  Pain Score 7     Pain Loc      Pain Edu?      Excl. in GC?    No data found.  Updated Vital Signs BP (!) 150/92 (BP Location: Left Arm)   Pulse 92   Temp 97.9 F (36.6 C) (Oral)   Resp 18   SpO2 95%   Visual Acuity Right Eye Distance:   Left Eye Distance:   Bilateral Distance:    Right Eye Near:   Left Eye Near:    Bilateral Near:     Physical Exam Constitutional:      General: She is not in acute distress. HENT:     Head: Normocephalic and atraumatic.     Right Ear: Tympanic membrane, ear canal and external ear normal.     Left Ear:  Tympanic membrane, ear canal and external ear normal.     Mouth/Throat:     Mouth: Mucous membranes are moist.     Pharynx: Oropharynx is clear.  Eyes:     General: No scleral icterus.    Extraocular Movements: Extraocular movements intact.     Conjunctiva/sclera: Conjunctivae normal.     Pupils: Pupils are equal, round, and reactive to light.  Cardiovascular:     Rate and Rhythm: Normal rate.  Pulmonary:     Effort: Pulmonary effort is normal. No respiratory distress.     Breath sounds: No wheezing.  Musculoskeletal:        General: No deformity. Normal range of motion.     Cervical back: Normal range of motion. No rigidity or tenderness.  Lymphadenopathy:     Cervical: No cervical adenopathy.  Skin:    Capillary Refill: Capillary refill takes less than 2 seconds.     Coloration: Skin is not jaundiced.     Findings: No bruising or rash.  Neurological:     Mental Status: She is alert.     Cranial Nerves: Cranial nerves are intact.     Sensory: Sensation is intact.     Motor: Motor function is intact.     Coordination: Coordination is intact.     Gait: Gait is intact.  Psychiatric:        Mood and Affect: Mood normal.        Behavior: Behavior normal.      UC Treatments / Results  Labs (all labs ordered are listed, but only abnormal results are displayed) Labs Reviewed - No data to display  EKG   Radiology No results found.  Procedures Procedures (including critical care time)  Medications Ordered in UC Medications  ketorolac (TORADOL) 30 MG/ML injection 30 mg (30 mg Intramuscular Given 04/30/20 1316)  dexamethasone (DECADRON) injection 10 mg (10 mg Intramuscular Given 04/30/20 1316)  ondansetron (ZOFRAN-ODT) disintegrating tablet 4 mg (4 mg Oral Given 04/30/20 1317)    Initial Impression / Assessment and Plan / UC Course  I have reviewed the triage vital signs and the nursing notes.  Pertinent labs & imaging results that were available during my care of  the patient were reviewed by me and considered in my medical decision making (see chart for details).     Afebrile, nontoxic, without head trauma or LOC.  No neurocognitive deficit.  Provided headache cocktail at patient's request as well as work note.  Provided contact information for PCP as well as headache clinic for further evaluation and management.  Will keep headache log in the interim.  Return precautions discussed, pt verbalized understanding and is agreeable to plan.  Final Clinical Impressions(s) / UC Diagnoses   Final diagnoses:  Migraine with aura and without status migrainosus, not intractable     Discharge Instructions     You were given medications today for your headache. Important to keep a log of your headaches: When they start, what alleviates them, pain on a scale of 1-10. Bring your headache log to your primary care for further evaluation as you may need to be on medications to help prevent headaches. Go to ER for worse headache of life, loss/change of vision, vomiting, fever, ear ringing, dizziness, weakness, facial droop/slurred speech, severe abdominal pain.    ED Prescriptions    None     PDMP not reviewed this encounter.   Hall-Potvin, [place], [place] 04/30/20 1319

## 2020-04-30 NOTE — Discharge Instructions (Addendum)
You were given medications today for your headache. Important to keep a log of your headaches: When they start, what alleviates them, pain on a scale of 1-10. Bring your headache log to your primary care for further evaluation as you may need to be on medications to help prevent headaches. Go to ER for worse headache of life, loss/change of vision, vomiting, fever, ear ringing, dizziness, weakness, facial droop/slurred speech, severe abdominal pain. 

## 2020-05-05 DIAGNOSIS — E559 Vitamin D deficiency, unspecified: Secondary | ICD-10-CM

## 2020-05-05 DIAGNOSIS — E876 Hypokalemia: Secondary | ICD-10-CM

## 2020-05-05 HISTORY — DX: Hypokalemia: E87.6

## 2020-05-05 HISTORY — DX: Vitamin D deficiency, unspecified: E55.9

## 2020-05-05 HISTORY — DX: Hypocalcemia: E83.51

## 2020-05-09 ENCOUNTER — Other Ambulatory Visit: Payer: Self-pay

## 2020-05-09 ENCOUNTER — Encounter (HOSPITAL_COMMUNITY): Payer: Self-pay

## 2020-05-09 ENCOUNTER — Emergency Department (HOSPITAL_COMMUNITY): Payer: HRSA Program

## 2020-05-09 ENCOUNTER — Emergency Department (HOSPITAL_COMMUNITY)
Admission: EM | Admit: 2020-05-09 | Discharge: 2020-05-10 | Disposition: A | Discharge status: Home / Self Care | Payer: HRSA Program | Attending: Emergency Medicine | Admitting: Emergency Medicine

## 2020-05-09 DIAGNOSIS — R030 Elevated blood-pressure reading, without diagnosis of hypertension: Secondary | ICD-10-CM | POA: Diagnosis not present

## 2020-05-09 DIAGNOSIS — U071 COVID-19: Secondary | ICD-10-CM | POA: Diagnosis not present

## 2020-05-09 DIAGNOSIS — R059 Cough, unspecified: Secondary | ICD-10-CM | POA: Diagnosis present

## 2020-05-09 MED ORDER — ALBUTEROL SULFATE HFA 108 (90 BASE) MCG/ACT IN AERS
2.0000 | INHALATION_SPRAY | RESPIRATORY_TRACT | Status: DC | PRN
  Administered 2020-05-10: 2 via RESPIRATORY_TRACT
  Filled 2020-05-09: qty 6.7

## 2020-05-09 MED ORDER — BENZONATATE 100 MG PO CAPS
200.0000 mg | ORAL_CAPSULE | Freq: Once | ORAL | Status: AC
  Administered 2020-05-10: 200 mg via ORAL
  Filled 2020-05-09: qty 2

## 2020-05-09 MED ORDER — DEXAMETHASONE 4 MG PO TABS
10.0000 mg | ORAL_TABLET | Freq: Once | ORAL | Status: AC
  Administered 2020-05-10: 10 mg via ORAL
  Filled 2020-05-09: qty 2

## 2020-05-09 MED ORDER — BENZONATATE 100 MG PO CAPS: 100.0000 mg | ORAL_CAPSULE | Freq: Three times a day (TID) | ORAL | 0 refills | Status: DC

## 2020-05-09 NOTE — ED Provider Notes (Signed)
[facility]-EMERGENCY DEPT Provider Note   [name]: [id number] Arrival date & time: 05/09/20  2109     History Chief Complaint  Patient presents with  . Cough    Christina Bailey is a 29 y.o. female.  Patient to ED with complaint of loss of taste/smell, and cough without SOB after testing positive for COVID-19 two days ago. No vomiting, headache, fever or sore throat. She has not taken anything for symptoms at home.   The history is provided by the patient. No language interpreter was used.  Cough Associated symptoms: no chest pain, no chills, no fever, no headaches, no myalgias, no rash and no shortness of breath        Past Medical History:  Diagnosis Date  . GERD (gastroesophageal reflux disease)   . PCOS (polycystic ovarian syndrome)   . Sickle cell trait (HCC)     There are no problems to display for this patient.   History reviewed. No pertinent surgical history.   OB History   No obstetric history on file.     Family History  Problem Relation Age of Onset  . Healthy Mother   . Sickle cell anemia Father     Social History   Tobacco Use  . Smoking status: Never Smoker  . Smokeless tobacco: Never Used  Vaping Use  . Vaping Use: Never used  Substance Use Topics  . Alcohol use: Not Currently  . Drug use: No    Home Medications Prior to Admission medications   Medication Sig Start Date End Date Taking? Authorizing Provider  meloxicam (MOBIC) 15 MG tablet Take 1 tablet (15 mg total) by mouth daily. Patient not taking: Reported on 04/30/2020 07/19/18   Muthersbaugh, Dahlia Client, PA-C  omeprazole (PRILOSEC) 20 MG capsule Take 1 capsule (20 mg total) by mouth daily. Patient not taking: Reported on 04/30/2020 07/19/18   Muthersbaugh, Dahlia Client, PA-C    Allergies    Patient has no known allergies.  Review of Systems   Review of Systems  Constitutional: Negative for chills and fever.  HENT:       Loss of taste and smell.   Respiratory:  Positive for cough. Negative for shortness of breath.   Cardiovascular: Negative.  Negative for chest pain.  Gastrointestinal: Negative.  Negative for abdominal pain and nausea.  Genitourinary: Negative.   Musculoskeletal: Negative.  Negative for myalgias.  Skin: Negative.  Negative for rash.  Neurological: Negative.  Negative for headaches.    Physical Exam Updated Vital Signs BP (!) 189/127 (BP Location: Left Arm)   Pulse (!) 108   Temp 99.3 F (37.4 C) (Oral)   Resp 19   SpO2 99%   Physical Exam Vitals and nursing note reviewed.  Constitutional:      General: She is not in acute distress.    Appearance: She is well-developed. She is obese. She is not ill-appearing.  HENT:     Head: Normocephalic.     Mouth/Throat:     Mouth: Mucous membranes are moist.  Cardiovascular:     Rate and Rhythm: Normal rate and regular rhythm.     Heart sounds: No murmur heard.   Pulmonary:     Effort: Pulmonary effort is normal.     Breath sounds: Normal breath sounds. No wheezing, rhonchi or rales.  Abdominal:     General: Bowel sounds are normal.     Palpations: Abdomen is soft.     Tenderness: There is no abdominal tenderness. There is no guarding or rebound.  Musculoskeletal:        General: Normal range of motion.     Cervical back: Normal range of motion and neck supple.     Right lower leg: No edema.     Left lower leg: No edema.  Skin:    General: Skin is warm and dry.     Findings: No rash.  Neurological:     Mental Status: She is alert and oriented to person, place, and time.     ED Results / Procedures / Treatments   Labs (all labs ordered are listed, but only abnormal results are displayed) Labs Reviewed - No data to display  EKG None  Radiology DG Chest 2 View  Result Date: 05/09/2020 CLINICAL DATA:  Cough EXAM: CHEST - 2 VIEW COMPARISON:  None. FINDINGS: The heart size and mediastinal contours are within normal limits. Both lungs are clear. The visualized  skeletal structures are unremarkable. IMPRESSION: No active cardiopulmonary disease. Electronically Signed   By: Jonna Clark M.D.   On: 05/09/2020 21:33    Procedures Procedures (including critical care time)  Medications Ordered in ED Medications - No data to display  ED Course  I have reviewed the triage vital signs and the nursing notes.  Pertinent labs & imaging results that were available during my care of the patient were reviewed by me and considered in my medical decision making (see chart for details).    MDM Rules/Calculators/A&P                          Patient with known COVID to ED for cough. No significant shortness of breath. No vomiting. She has lost her taste and smell. She has not taken anything at home for symptoms.   Overall well appearing. No active coughing during exam. VSS, normal O2 saturations.   Blood pressure is elevated. On chart review, it has been elevated in the past. No chest pain. Not on medications nor has she been told she had hypertension. No PCP.   She can be discharged home. Recommended establishing with PCP for recheck of blood pressure when no longer symptomatic from COVID infection. Will provide Albuterol inhaler, give single dose Decadron in ED, Rx Tessalon.   Final Clinical Impression(s) / ED Diagnoses Final diagnoses:  None   1. COVID infection 2. Cough 3. High blood pressure  Rx / DC Orders ED Discharge Orders    None       Elpidio Anis, PA-C 05/09/20 2249    Cathren Laine, MD 05/10/20 1538

## 2020-05-09 NOTE — ED Triage Notes (Signed)
Patient arrived stating she tested positive for covid-19 on Friday. Reports a cough and taste. Declines taking anything at home.

## 2020-05-09 NOTE — Discharge Instructions (Addendum)
Quarantine at home to prevent spreading COVID to others. Use Tessalon as prescribed for cough. You can use Albuterol inhaler every 4 hours as needed if this helps. Also recommend Robitussin cough syrup found over-the-counter if these measure do not help relieve your cough.   As further treatment for your COVID infection, it is recommended you receive the antibody infusion. The infusion clinic will contact you to schedule a time to come in and directions on where to go.   Your blood pressure has been elevated on this visit as well as other emergency department visits. It is recommended that you find a primary care provider to discuss medications for control.

## 2020-05-10 ENCOUNTER — Telehealth (HOSPITAL_COMMUNITY): Payer: Self-pay | Admitting: *Deleted

## 2020-05-10 NOTE — Telephone Encounter (Signed)
Called to Discuss with patient about Covid symptoms and the use of the monoclonal antibody infusion for those with mild to moderate Covid symptoms and at a high risk of hospitalization.     Pt appears to qualify for this infusion due to co-morbid conditions and/or a member of an at-risk group in accordance with the FDA Emergency Use Authorization.    Pt states symptoms started 05/05/20 and had a positive test on 05/07/20 at [facility] in [place]. Symptoms include cough and loss of taste.   Pt states she is 41ft 6in and weighs 320lbs, which puts her BMI at 51.6.   Pt informed of estimated costs of treatment.   Pt informed an APP will be in touch with her.

## 2020-05-11 ENCOUNTER — Telehealth: Payer: Self-pay | Admitting: Unknown Physician Specialty

## 2020-05-11 NOTE — Telephone Encounter (Signed)
Called to Discuss with patient about Covid symptoms and the use of the monoclonal antibody infusion for those with mild to moderate Covid symptoms and at a high risk of hospitalization.     Pt appears to qualify for this infusion due to co-morbid conditions and/or a member of an at-risk group in accordance with the FDA Emergency Use Authorization.    Unable to reach pt   LMOM 

## 2020-05-18 ENCOUNTER — Ambulatory Visit (HOSPITAL_COMMUNITY)
Admission: RE | Admit: 2020-05-18 | Discharge: 2020-05-18 | Disposition: A | Discharge status: Home / Self Care | Payer: Medicaid Other | Source: Ambulatory Visit | Attending: Nurse Practitioner | Admitting: Nurse Practitioner

## 2020-05-18 ENCOUNTER — Other Ambulatory Visit: Payer: Self-pay

## 2020-05-18 ENCOUNTER — Ambulatory Visit (INDEPENDENT_AMBULATORY_CARE_PROVIDER_SITE_OTHER): Payer: Self-pay | Admitting: Nurse Practitioner

## 2020-05-18 VITALS — BP 144/94 | HR 104 | Temp 97.5°F | Wt 318.0 lb

## 2020-05-18 DIAGNOSIS — R0602 Shortness of breath: Secondary | ICD-10-CM | POA: Diagnosis present

## 2020-05-18 DIAGNOSIS — Z8616 Personal history of COVID-19: Secondary | ICD-10-CM | POA: Insufficient documentation

## 2020-05-18 MED ORDER — PREDNISONE 10 MG PO TABS: ORAL_TABLET | ORAL | 0 refills | Status: DC

## 2020-05-18 NOTE — Progress Notes (Signed)
@Patient  ID: , female    DOB: Jun 15, 1990, 29 y.o.   MRN: [id number]  Chief Complaint  Patient presents with  . New Patient (Initial Visit)    COVID 12/3, Did not get infusion and is not vaccinated. Patient stated she does get winded quickly, she works for [employer].      Referring provider: No ref. provider found   29 year old female with history of GERD, PCOS, sickle cell trait.  HPI  Patient presents today for post COVID care clinic visit.  She was seen in the ED on 05/09/2020.  Chest x-ray at that time did not show pneumonia.  Patient was given prescription for [facility] for cough.  Patient states that since this time she has continued to have shortness of breath especially with exertion.  Her taste and smell has not fully recovered.  She is trying to stay active and stay well-hydrated.  It was noted that patient does not currently have a primary care physician.  We discussed that we will set up appointment for establish care with a new primary care physician during appointment today.  Denies f/c/s, n/v/d, hemoptysis, PND, chest pain or edema.      No Known Allergies   There is no immunization history on file for this patient.  Past Medical History:  Diagnosis Date  . GERD (gastroesophageal reflux disease)   . PCOS (polycystic ovarian syndrome)   . Sickle cell trait (HCC)     Tobacco History: Social History   Tobacco Use  Smoking Status Never Smoker  Smokeless Tobacco Never Used   Counseling given: Yes   Outpatient Encounter Medications as of 05/18/2020  Medication Sig  . benzonatate (TESSALON) 100 MG capsule Take 1 capsule (100 mg total) by mouth every 8 (eight) hours. (Patient not taking: Reported on 05/18/2020)  . meloxicam (MOBIC) 15 MG tablet Take 1 tablet (15 mg total) by mouth daily. (Patient not taking: No sig reported)  . omeprazole (PRILOSEC) 20 MG capsule Take 1 capsule (20 mg total) by mouth daily. (Patient not  taking: No sig reported)  . predniSONE (DELTASONE) 10 MG tablet Take 4 tabs for 2 days, then 3 tabs for 2 days, then 2 tabs for 2 days, then 1 tab for 2 days, then stop   No facility-administered encounter medications on file as of 05/18/2020.     Review of Systems  Review of Systems  Constitutional: Negative for fatigue and fever.  HENT: Negative.   Respiratory: Positive for shortness of breath. Negative for cough.   Cardiovascular: Negative.  Negative for chest pain, palpitations and leg swelling.  Gastrointestinal: Negative.   Allergic/Immunologic: Negative.   Neurological: Negative.   Psychiatric/Behavioral: Negative.        Physical Exam  BP (!) 144/94 (BP Location: Left Arm)   Pulse (!) 104   Temp (!) 97.5 F (36.4 C)   Wt (!) 318 lb 0.1 oz (144.2 kg)   SpO2 97%   BMI 52.92 kg/m   Wt Readings from Last 5 Encounters:  05/18/20 (!) 318 lb 0.1 oz (144.2 kg)  07/18/18 280 lb (127 kg)  09/08/17 272 lb (123.4 kg)     Physical Exam Vitals and nursing note reviewed.  Constitutional:      General: She is not in acute distress.    Appearance: She is well-developed and well-nourished.  Cardiovascular:     Rate and Rhythm: Normal rate and regular rhythm.  Pulmonary:     Effort: Pulmonary effort is normal.  Breath sounds: Normal breath sounds.  Musculoskeletal:     Right lower leg: No edema.     Left lower leg: No edema.  Neurological:     Mental Status: She is alert and oriented to person, place, and time.  Psychiatric:        Mood and Affect: Mood and affect and mood normal.        Behavior: Behavior normal.       Imaging: DG Chest 2 View  Result Date: 05/09/2020 CLINICAL DATA:  Cough EXAM: CHEST - 2 VIEW COMPARISON:  None. FINDINGS: The heart size and mediastinal contours are within normal limits. Both lungs are clear. The visualized skeletal structures are unremarkable. IMPRESSION: No active cardiopulmonary disease. Electronically Signed   By: Jonna Clark M.D.   On: 05/09/2020 21:33     Assessment & Plan:   History of COVID-19 Shortness of breath:   Stay well hydrated  Stay active  Deep breathing exercises  May take tylenol for fever or pain  May take mucinex  twice daily  Will order chest x ray  Will order labs    Follow up:  Follow up in 2 weeks or sooner if needed      Ivonne Andrew, NP 05/18/2020

## 2020-05-18 NOTE — Patient Instructions (Signed)
Covid 19 Shortness of breath:   Stay well hydrated  Stay active  Deep breathing exercises  May take tylenol for fever or pain  May take mucinex  twice daily  Will order chest x ray  Will order labs    Follow up:  Follow up in 2 weeks or sooner if needed

## 2020-05-18 NOTE — Assessment & Plan Note (Signed)
Shortness of breath:   Stay well hydrated  Stay active  Deep breathing exercises  May take tylenol for fever or pain  May take mucinex  twice daily  Will order chest x ray  Will order labs    Follow up:  Follow up in 2 weeks or sooner if needed

## 2020-05-19 ENCOUNTER — Telehealth: Payer: Self-pay | Admitting: Nurse Practitioner

## 2020-05-19 LAB — COMPREHENSIVE METABOLIC PANEL
ALT: 23 IU/L (ref 0–32)
AST: 20 IU/L (ref 0–40)
Albumin/Globulin Ratio: 1.1 — ABNORMAL LOW (ref 1.2–2.2)
Albumin: 3.8 g/dL — ABNORMAL LOW (ref 3.9–5.0)
Alkaline Phosphatase: 101 IU/L (ref 44–121)
BUN/Creatinine Ratio: 7 — ABNORMAL LOW (ref 9–23)
BUN: 6 mg/dL (ref 6–20)
Bilirubin Total: 0.4 mg/dL (ref 0.0–1.2)
CO2: 23 mmol/L (ref 20–29)
Calcium: 8.7 mg/dL (ref 8.7–10.2)
Chloride: 103 mmol/L (ref 96–106)
Creatinine, Ser: 0.92 mg/dL (ref 0.57–1.00)
GFR calc Af Amer: 99 mL/min/{1.73_m2} (ref >59)
GFR calc non Af Amer: 86 mL/min/{1.73_m2} (ref >59)
Globulin, Total: 3.6 g/dL (ref 1.5–4.5)
Glucose: 150 mg/dL — ABNORMAL HIGH (ref 65–99)
Potassium: 4.1 mmol/L (ref 3.5–5.2)
Sodium: 139 mmol/L (ref 134–144)
Total Protein: 7.4 g/dL (ref 6.0–8.5)

## 2020-05-19 LAB — CBC
Hematocrit: 40.6 % (ref 34.0–46.6)
Hemoglobin: 12.6 g/dL (ref 11.1–15.9)
MCH: 21.8 pg — ABNORMAL LOW (ref 26.6–33.0)
MCHC: 31 g/dL — ABNORMAL LOW (ref 31.5–35.7)
MCV: 70 fL — ABNORMAL LOW (ref 79–97)
Platelets: 457 10*3/uL — ABNORMAL HIGH (ref 150–450)
RBC: 5.77 x10E6/uL — ABNORMAL HIGH (ref 3.77–5.28)
RDW: 19.7 % — ABNORMAL HIGH (ref 11.7–15.4)
WBC: 12.6 10*3/uL — ABNORMAL HIGH (ref 3.4–10.8)

## 2020-05-19 NOTE — Telephone Encounter (Signed)
-----   Message from Christina Andrew, NP sent at 05/19/2020  8:46 AM EST ----- Please call to let patient know that her labs are about the same as they were previously her chest xray did not show pneumonia.

## 2020-05-19 NOTE — Telephone Encounter (Signed)
Patient notified of results, verbally understood. No additional questions.

## 2020-06-01 ENCOUNTER — Encounter: Payer: Self-pay | Admitting: Family Medicine

## 2020-06-01 ENCOUNTER — Ambulatory Visit (INDEPENDENT_AMBULATORY_CARE_PROVIDER_SITE_OTHER): Payer: Self-pay | Admitting: Family Medicine

## 2020-06-01 ENCOUNTER — Other Ambulatory Visit: Payer: Self-pay

## 2020-06-01 VITALS — BP 158/92 | HR 94 | Temp 99.0°F | Ht 65.0 in | Wt 324.6 lb

## 2020-06-01 DIAGNOSIS — R030 Elevated blood-pressure reading, without diagnosis of hypertension: Secondary | ICD-10-CM

## 2020-06-01 DIAGNOSIS — Z8742 Personal history of other diseases of the female genital tract: Secondary | ICD-10-CM

## 2020-06-01 DIAGNOSIS — I10 Essential (primary) hypertension: Secondary | ICD-10-CM

## 2020-06-01 DIAGNOSIS — Z8616 Personal history of COVID-19: Secondary | ICD-10-CM

## 2020-06-01 DIAGNOSIS — Z6841 Body Mass Index (BMI) 40.0 and over, adult: Secondary | ICD-10-CM

## 2020-06-01 DIAGNOSIS — Z0000 Encounter for general adult medical examination without abnormal findings: Secondary | ICD-10-CM

## 2020-06-01 DIAGNOSIS — E66813 Obesity, class 3: Secondary | ICD-10-CM

## 2020-06-01 DIAGNOSIS — Z09 Encounter for follow-up examination after completed treatment for conditions other than malignant neoplasm: Secondary | ICD-10-CM

## 2020-06-01 LAB — POCT GLYCOSYLATED HEMOGLOBIN (HGB A1C)
HbA1c POC (<> result, manual entry): 6.6 % (ref 4.0–5.6)
HbA1c, POC (controlled diabetic range): 6.6 % (ref 0.0–7.0)
HbA1c, POC (prediabetic range): 6.6 % — AB (ref 5.7–6.4)
Hemoglobin A1C: 6.6 % — AB (ref 4.0–5.6)

## 2020-06-01 MED ORDER — HYDROCHLOROTHIAZIDE 25 MG PO TABS: 25.0000 mg | ORAL_TABLET | Freq: Every day | ORAL | 3 refills | Status: DC

## 2020-06-01 NOTE — Progress Notes (Signed)
[facility]   New Patient--Hospital Follow Up--Establish Care  Subjective:  Patient ID: Christina Bailey, adult    DOB: [birth year]  Age: 29 y.o. MRN: [id number]  CC:  Chief Complaint  Patient presents with  . New Patient (Initial Visit)    New patient ,  discuss blood pressure , not taking anything for blood pressure , not checking it at home    HPI Christina Bailey is a 29 year old female who presents for Hospital Follow Up and to Establish Care today.    Patient Active Problem List   Diagnosis Date Noted  . History of COVID-19 05/18/2020  . Shortness of breath 05/18/2020    Current Status: This will be Christina Bailey's initial office visit with me. She was  Previously she has not been seeing not seeing a for her PCP regularly for her primary care needs. Today, she states that she is doing well with no complaints. She has c/o increasing weight gain lately. She is currently very physically active on her job, does not exercise regularly. She denies fevers, chills, fatigue, recent infections, weight loss, and night sweats. She has not had any headaches, visual changes, dizziness, and falls. No chest pain, heart palpitations, cough and shortness of breath reported. Denies GI problems such as nausea, vomiting, diarrhea, and constipation. She has no reports of blood in stools, dysuria and hematuria. No depression or anxiety reported today. She is currently not taking any prescribed medications. She denies pain today.    Past Medical History:  Diagnosis Date  . Anxiety   . GERD (gastroesophageal reflux disease)   . PCOS (polycystic ovarian syndrome)   . Sickle cell trait [facility])     Past Surgical History:  Procedure Laterality Date  . concussion     2014; [employer] accident; received staples to head    Family History  Problem Relation Age of Onset  . Healthy Mother   . Fibroids Mother   . Sickle cell anemia Father   . Hypertension  Maternal Grandmother   . Diabetes Maternal Grandmother   . Hypertension Maternal Grandfather   . Thyroid disease Maternal Grandfather   . Sickle cell trait Paternal Grandmother   . Leukemia Maternal Aunt     Social History   Socioeconomic History  . Marital status: Single    Spouse name: Not on file  . Number of children: Not on file  . Years of education: Not on file  . Highest education level: Not on file  Occupational History  . Not on file  Tobacco Use  . Smoking status: Never Smoker  . Smokeless tobacco: Never Used  Vaping Use  . Vaping Use: Never used  Substance and Sexual Activity  . Alcohol use: Not Currently  . Drug use: No  . Sexual activity: Not on file  Other Topics Concern  . Not on file  Social History Narrative  . Not on file   Social Determinants of Health   Financial Resource Strain: Not on file  Food Insecurity: Not on file  Transportation Needs: Not on file  Physical Activity: Not on file  Stress: Not on file  Social Connections: Not on file  Intimate Partner Violence: Not on file    Outpatient Medications Prior to Visit  Medication Sig Dispense Refill  . benzonatate (TESSALON) 100 MG capsule Take 1 capsule (100 mg total) by mouth every 8 (eight) hours. (Patient not taking: Reported on 05/18/2020) 21 capsule 0  . meloxicam (  MOBIC) 15 MG tablet Take 1 tablet (15 mg total) by mouth daily. (Patient not taking: No sig reported) 30 tablet 0  . omeprazole (PRILOSEC) 20 MG capsule Take 1 capsule (20 mg total) by mouth daily. (Patient not taking: No sig reported) 30 capsule 0  . predniSONE (DELTASONE) 10 MG tablet Take 4 tabs for 2 days, then 3 tabs for 2 days, then 2 tabs for 2 days, then 1 tab for 2 days, then stop 20 tablet 0   No facility-administered medications prior to visit.    No Known Allergies  ROS Review of Systems  Constitutional: Negative.   HENT: Negative.   Eyes: Negative.   Respiratory: Negative.   Cardiovascular: Negative.    Gastrointestinal: Positive for abdominal distention (obese).  Endocrine: Negative.   Genitourinary: Negative.   Musculoskeletal: Negative.   Skin: Negative.   Allergic/Immunologic: Negative.   Neurological: Positive for dizziness (occasional ) and headaches (occasional ).  Hematological: Negative.   Psychiatric/Behavioral: Negative.    Objective:    Physical Exam Vitals and nursing note reviewed.  Constitutional:      Appearance: Normal appearance.  HENT:     Head: Normocephalic and atraumatic.     Nose: Nose normal.     Mouth/Throat:     Mouth: Mucous membranes are moist.     Pharynx: Oropharynx is clear.  Cardiovascular:     Rate and Rhythm: Normal rate and regular rhythm.     Pulses: Normal pulses.     Heart sounds: Normal heart sounds.  Pulmonary:     Effort: Pulmonary effort is normal.     Breath sounds: Normal breath sounds.  Abdominal:     General: Bowel sounds are normal.     Palpations: Abdomen is soft.  Musculoskeletal:        General: Normal range of motion.     Cervical back: Normal range of motion and neck supple.  Skin:    General: Skin is warm and dry.  Neurological:     General: No focal deficit present.     Mental Status: She is alert and oriented to person, place, and time.  Psychiatric:        Mood and Affect: Mood normal.        Behavior: Behavior normal.        Thought Content: Thought content normal.        Judgment: Judgment normal.    BP (!) 158/92   Pulse 94   Temp 99 F (37.2 C)   Ht 5\' 5"  (1.651 m)   Wt (!) 324 lb 9.6 oz (147.2 kg)   SpO2 98%   BMI 54.02 kg/m  Wt Readings from Last 3 Encounters:  06/01/20 (!) 324 lb 9.6 oz (147.2 kg)  05/18/20 (!) 318 lb 0.1 oz (144.2 kg)  07/18/18 280 lb (127 kg)     Health Maintenance Due  Topic Date Due  . Hepatitis C Screening  Never done  . COVID-19 Vaccine (1) Never done  . HIV Screening  Never done  . PAP-Cervical Cytology Screening  Never done  . PAP SMEAR-Modifier  Never done   . INFLUENZA VACCINE  Never done    There are no preventive care reminders to display for this patient.  No results found for: TSH Lab Results  Component Value Date   WBC 12.6 (H) 05/18/2020   HGB 12.6 05/18/2020   HCT 40.6 05/18/2020   MCV 70 (L) 05/18/2020   PLT 457 (H) 05/18/2020   Lab Results  Component Value Date   NA 139 05/18/2020   K 4.1 05/18/2020   CO2 23 05/18/2020   GLUCOSE 150 (H) 05/18/2020   BUN 6 05/18/2020   CREATININE 0.92 05/18/2020   BILITOT 0.4 05/18/2020   ALKPHOS 101 05/18/2020   AST 20 05/18/2020   ALT 23 05/18/2020   PROT 7.4 05/18/2020   ALBUMIN 3.8 (L) 05/18/2020   CALCIUM 8.7 05/18/2020   ANIONGAP 9 07/18/2018   No results found for: CHOL No results found for: HDL No results found for: LDLCALC No results found for: TRIG No results found for: [facility] Lab Results  Component Value Date   HGBA1C 6.6 (A) 06/01/2020   HGBA1C 6.6 06/01/2020   HGBA1C 6.6 (A) 06/01/2020   HGBA1C 6.6 06/01/2020    Assessment & Plan:   1. Hospital discharge follow-up  2. History of COVID-19 Diagnosed 05/11/2020. She has completed quarantine and is doing well with no signs or symptoms of recurrence.   3. Hypertension, unspecified type We will initiate HCTZ today. She will continue to take medications as prescribed, to decrease high sodium intake, excessive alcohol intake, increase potassium intake, smoking cessation, and increase physical activity of at least 30 minutes of cardio activity daily. She will continue to follow Heart Healthy or DASH diet. - hydrochlorothiazide (HYDRODIURIL) 25 MG tablet; Take 1 tablet (25 mg total) by mouth daily.  Dispense: 90 tablet; Refill: 3  4. Elevated blood-pressure reading without diagnosis of hypertension Stabilized today. She will report to ED if he eperiences severe headaches, confusion, seizures, double vision, and blurred vision, nausea and vomiting.  5. History of PCOS We will get labs for FSH/LH, HCG, Testosterone,  Prolactin, and Thyroid Panel at her next office visit.  - Ambulatory referral to Gynecology  6. Class 3 severe obesity due to excess calories with serious comorbidity and body mass index (BMI) of 50.0 to 59.9 in adult [facility]) Body mass index is 54.02 kg/m.  Goal BMI  is <30. Encouraged efforts to reduce weight include engaging in physical activity as tolerated with goal of 150 minutes per week. Improve dietary choices and eat a meal regimen consistent with a Mediterranean or DASH diet. Reduce simple carbohydrates. Do not skip meals and eat healthy snacks throughout the day to avoid over-eating at dinner. Set a goal weight loss that is achievable for you.  7. Health care maintenance - CBC with Differential - Comprehensive metabolic panel - TSH - Lipid Panel - Vitamin B12 - Vitamin D, 25-hydroxy - Glucose (CBG), Fasting - HgB A1c  8. Follow up Follow Up for Nurse Visit Only for Blood Check in 2 weeks.  Follow up for Office Visit in 1 month.   Meds ordered this encounter  Medications  . hydrochlorothiazide (HYDRODIURIL) 25 MG tablet    Sig: Take 1 tablet (25 mg total) by mouth daily.    Dispense:  90 tablet    Refill:  3    Orders Placed This Encounter  Procedures  . CBC with Differential  . Comprehensive metabolic panel  . TSH  . Lipid Panel  . Vitamin B12  . Vitamin D, 25-hydroxy  . Ambulatory referral to Gynecology  . Glucose (CBG), Fasting  . HgB A1c     Referral Orders     Ambulatory referral to Gynecology   Raliegh Ip, MSN, ANE, FNP-BC [facility] [address] [place], [place] [address] [phone]- fax   Problem List Items Addressed This Visit  Other   History of COVID-19    Other Visit Diagnoses    Hospital discharge follow-up    -  Primary   Hypertension, unspecified type       Relevant Medications   hydrochlorothiazide (HYDRODIURIL) 25 MG  tablet   Elevated blood-pressure reading without diagnosis of hypertension       History of PCOS       Relevant Orders   Ambulatory referral to Gynecology   Class 3 severe obesity due to excess calories with serious comorbidity and body mass index (BMI) of 50.0 to 59.9 in adult [facility](HCC)       Health care maintenance       Relevant Orders   CBC with Differential   Comprehensive metabolic panel   TSH   Lipid Panel   Vitamin B12   Vitamin D, 25-hydroxy   Glucose (CBG), Fasting   HgB A1c (Completed)   Follow up          Meds ordered this encounter  Medications  . hydrochlorothiazide (HYDRODIURIL) 25 MG tablet    Sig: Take 1 tablet (25 mg total) by mouth daily.    Dispense:  90 tablet    Refill:  3    Follow-up: No follow-ups on file.    Kallie LocksNatalie M Siera Beyersdorf, FNP

## 2020-06-02 LAB — COMPREHENSIVE METABOLIC PANEL
ALT: 27 IU/L (ref 0–32)
AST: 23 IU/L (ref 0–40)
Albumin/Globulin Ratio: 1.3 (ref 1.2–2.2)
Albumin: 3.7 g/dL — ABNORMAL LOW (ref 3.9–5.0)
Alkaline Phosphatase: 91 IU/L (ref 44–121)
BUN/Creatinine Ratio: 7 — ABNORMAL LOW (ref 9–23)
BUN: 6 mg/dL (ref 6–20)
Bilirubin Total: 0.5 mg/dL (ref 0.0–1.2)
CO2: 22 mmol/L (ref 20–29)
Calcium: 8.3 mg/dL — ABNORMAL LOW (ref 8.7–10.2)
Chloride: 105 mmol/L (ref 96–106)
Creatinine, Ser: 0.9 mg/dL (ref 0.57–1.00)
GFR calc Af Amer: 101 mL/min/{1.73_m2} (ref >59)
GFR calc non Af Amer: 88 mL/min/{1.73_m2} (ref >59)
Globulin, Total: 2.8 g/dL (ref 1.5–4.5)
Glucose: 115 mg/dL — ABNORMAL HIGH (ref 65–99)
Potassium: 3.2 mmol/L — ABNORMAL LOW (ref 3.5–5.2)
Sodium: 142 mmol/L (ref 134–144)
Total Protein: 6.5 g/dL (ref 6.0–8.5)

## 2020-06-02 LAB — CBC WITH DIFFERENTIAL/PLATELET
Basophils Absolute: 0 10*3/uL (ref 0.0–0.2)
Basos: 0 %
EOS (ABSOLUTE): 0.2 10*3/uL (ref 0.0–0.4)
Eos: 1 %
Hematocrit: 35.5 % (ref 34.0–46.6)
Hemoglobin: 11.4 g/dL (ref 11.1–15.9)
Immature Grans (Abs): 0 10*3/uL (ref 0.0–0.1)
Immature Granulocytes: 0 %
Lymphocytes Absolute: 5 10*3/uL — ABNORMAL HIGH (ref 0.7–3.1)
Lymphs: 31 %
MCH: 22.2 pg — ABNORMAL LOW (ref 26.6–33.0)
MCHC: 32.1 g/dL (ref 31.5–35.7)
MCV: 69 fL — ABNORMAL LOW (ref 79–97)
Monocytes Absolute: 0.8 10*3/uL (ref 0.1–0.9)
Monocytes: 5 %
Neutrophils Absolute: 10 10*3/uL — ABNORMAL HIGH (ref 1.4–7.0)
Neutrophils: 63 %
Platelets: 425 10*3/uL (ref 150–450)
RBC: 5.14 x10E6/uL (ref 3.77–5.28)
RDW: 19.6 % — ABNORMAL HIGH (ref 11.7–15.4)
WBC: 16.1 10*3/uL — ABNORMAL HIGH (ref 3.4–10.8)

## 2020-06-02 LAB — LIPID PANEL
Chol/HDL Ratio: 3.9 ratio (ref 0.0–4.4)
Cholesterol, Total: 165 mg/dL (ref 100–199)
HDL: 42 mg/dL (ref >39)
LDL Chol Calc (NIH): 96 mg/dL (ref 0–99)
Triglycerides: 152 mg/dL — ABNORMAL HIGH (ref 0–149)
VLDL Cholesterol Cal: 27 mg/dL (ref 5–40)

## 2020-06-02 LAB — VITAMIN B12: Vitamin B-12: 270 pg/mL (ref 232–1245)

## 2020-06-02 LAB — TSH: TSH: 4.84 u[IU]/mL — ABNORMAL HIGH (ref 0.450–4.500)

## 2020-06-02 LAB — VITAMIN D 25 HYDROXY (VIT D DEFICIENCY, FRACTURES): Vit D, 25-Hydroxy: 12.9 ng/mL — ABNORMAL LOW (ref 30.0–100.0)

## 2020-06-04 ENCOUNTER — Other Ambulatory Visit: Payer: Self-pay | Admitting: Family Medicine

## 2020-06-04 ENCOUNTER — Encounter: Payer: Self-pay | Admitting: Family Medicine

## 2020-06-04 DIAGNOSIS — E876 Hypokalemia: Secondary | ICD-10-CM

## 2020-06-04 DIAGNOSIS — E559 Vitamin D deficiency, unspecified: Secondary | ICD-10-CM

## 2020-06-04 MED ORDER — POTASSIUM CHLORIDE CRYS ER 20 MEQ PO TBCR: 20.0000 meq | EXTENDED_RELEASE_TABLET | Freq: Every day | ORAL | 1 refills | Status: DC

## 2020-06-04 MED ORDER — VITAMIN D (ERGOCALCIFEROL) 1.25 MG (50000 UNIT) PO CAPS: 50000.0000 [IU] | ORAL_CAPSULE | ORAL | 3 refills | Status: DC

## 2020-06-17 ENCOUNTER — Encounter: Payer: Self-pay | Admitting: Family Medicine

## 2020-07-02 ENCOUNTER — Ambulatory Visit (INDEPENDENT_AMBULATORY_CARE_PROVIDER_SITE_OTHER): Payer: Self-pay | Admitting: Family Medicine

## 2020-07-02 ENCOUNTER — Other Ambulatory Visit: Payer: Self-pay

## 2020-07-02 ENCOUNTER — Encounter: Payer: Self-pay | Admitting: Family Medicine

## 2020-07-02 VITALS — BP 155/106 | HR 109 | Ht 65.0 in | Wt 323.0 lb

## 2020-07-02 DIAGNOSIS — Z8742 Personal history of other diseases of the female genital tract: Secondary | ICD-10-CM

## 2020-07-02 DIAGNOSIS — I10 Essential (primary) hypertension: Secondary | ICD-10-CM

## 2020-07-02 DIAGNOSIS — I160 Hypertensive urgency: Secondary | ICD-10-CM

## 2020-07-02 DIAGNOSIS — R030 Elevated blood-pressure reading, without diagnosis of hypertension: Secondary | ICD-10-CM

## 2020-07-02 DIAGNOSIS — Z09 Encounter for follow-up examination after completed treatment for conditions other than malignant neoplasm: Secondary | ICD-10-CM

## 2020-07-02 NOTE — Progress Notes (Signed)
[facility]    Established Patient Office Visit  Subjective:  Patient ID: Christina Bailey, adult    DOB: 1990/08/02  Age: 30 y.o. MRN: [id number]  CC:  Chief Complaint  Patient presents with  . Follow-up    HPI Christina Bailey is 30 years old and presents today for Follow Up.   Patient Active Problem List   Diagnosis Date Noted  . History of COVID-19 05/18/2020  . Shortness of breath 05/18/2020   Current Status: Since she last office visit, patient continues to have c/o shortness of breath, post Covid infection. Patient has had follow up with [facility]. She denies fevers, chills, fatigue, recent infections, weight loss, and night sweats. She has not had any headaches, visual changes, dizziness, and falls. No chest pain, heart palpitations, cough and shortness of breath reported. Denies GI problems such as nausea, vomiting, diarrhea, and constipation. She has no reports of blood in stools, dysuria and hematuria. No depression or anxiety, and denies suicidal ideations, homicidal ideations, or auditory hallucinations. She is taking all medications as prescribed. She denies pain today.   Past Medical History:  Diagnosis Date  . Anxiety   . GERD (gastroesophageal reflux disease)   . Hypocalcemia 05/2020  . Hypokalemia 05/2020  . PCOS (polycystic ovarian syndrome)   . Sickle cell trait (HCC)   . Vitamin D deficiency 05/2020    Past Surgical History:  Procedure Laterality Date  . concussion     2014; [employer] accident; received staples to head    Family History  Problem Relation Age of Onset  . Healthy Mother   . Fibroids Mother   . Sickle cell anemia Father   . Hypertension Maternal Grandmother   . Diabetes Maternal Grandmother   . Hypertension Maternal Grandfather   . Thyroid disease Maternal Grandfather   . Sickle cell trait Paternal Grandmother   . Leukemia Maternal Aunt     Social History    Socioeconomic History  . Marital status: Single    Spouse name: Not on file  . Number of children: Not on file  . Years of education: Not on file  . Highest education level: Not on file  Occupational History  . Not on file  Tobacco Use  . Smoking status: Never Smoker  . Smokeless tobacco: Never Used  Vaping Use  . Vaping Use: Never used  Substance and Sexual Activity  . Alcohol use: Not Currently  . Drug use: No  . Sexual activity: Not on file  Other Topics Concern  . Not on file  Social History Narrative  . Not on file   Social Determinants of Health   Financial Resource Strain: Not on file  Food Insecurity: Not on file  Transportation Needs: Not on file  Physical Activity: Not on file  Stress: Not on file  Social Connections: Not on file  Intimate Partner Violence: Not on file    Outpatient Medications Prior to Visit  Medication Sig Dispense Refill  . hydrochlorothiazide (HYDRODIURIL) 25 MG tablet Take 1 tablet (25 mg total) by mouth daily. 90 tablet 3  . potassium chloride SA (KLOR-CON) 20 MEQ tablet Take 1 tablet (20 mEq total) by mouth daily. (Patient not taking: Reported on 07/02/2020) 30 tablet 1  . Vitamin D, Ergocalciferol, (DRISDOL) 1.25 MG (50000 UNIT) CAPS capsule Take 1 capsule (50,000 Units total) by mouth every 7 (seven) days. (Patient not taking: Reported on 07/02/2020) 5 capsule 3   No facility-administered medications  prior to visit.    No Known Allergies  ROS Review of Systems  Constitutional: Negative.   HENT: Negative.   Eyes: Negative.   Respiratory: Positive for shortness of breath (occasional ).   Musculoskeletal: Negative.   Neurological: Positive for dizziness (occasional ) and headaches (occasional ).  Psychiatric/Behavioral: Negative.       Objective:    Physical Exam Vitals and nursing note reviewed.  Constitutional:      Appearance: Normal appearance.  HENT:     Head: Normocephalic and atraumatic.     Nose: Nose normal.      Mouth/Throat:     Mouth: Mucous membranes are moist.     Pharynx: Oropharynx is clear.  Cardiovascular:     Rate and Rhythm: Normal rate and regular rhythm.     Pulses: Normal pulses.     Heart sounds: Normal heart sounds.  Pulmonary:     Effort: Pulmonary effort is normal.     Breath sounds: Normal breath sounds.  Abdominal:     General: Bowel sounds are normal.     Palpations: Abdomen is soft.  Musculoskeletal:     Cervical back: Normal range of motion and neck supple.  Neurological:     Mental Status: Christina Bailey "Ke" is alert.    BP (!) 155/106   Pulse (!) 109   Ht 5\' 5"  (1.651 m)   Wt (!) 323 lb (146.5 kg)   SpO2 98%   BMI 53.75 kg/m  Wt Readings from Last 3 Encounters:  07/02/20 (!) 323 lb (146.5 kg)  06/01/20 (!) 324 lb 9.6 oz (147.2 kg)  05/18/20 (!) 318 lb 0.1 oz (144.2 kg)     Health Maintenance Due  Topic Date Due  . Hepatitis C Screening  Never done  . COVID-19 Vaccine (1) Never done  . HIV Screening  Never done  . PAP-Cervical Cytology Screening  Never done  . PAP SMEAR-Modifier  Never done  . INFLUENZA VACCINE  Never done    There are no preventive care reminders to display for this patient.  Lab Results  Component Value Date   TSH 4.840 (H) 06/01/2020   Lab Results  Component Value Date   WBC 16.1 (H) 06/01/2020   HGB 11.4 06/01/2020   HCT 35.5 06/01/2020   MCV 69 (L) 06/01/2020   PLT 425 06/01/2020   Lab Results  Component Value Date   NA 142 06/01/2020   K 3.2 (L) 06/01/2020   CO2 22 06/01/2020   GLUCOSE 115 (H) 06/01/2020   BUN 6 06/01/2020   CREATININE 0.90 06/01/2020   BILITOT 0.5 06/01/2020   ALKPHOS 91 06/01/2020   AST 23 06/01/2020   ALT 27 06/01/2020   PROT 6.5 06/01/2020   ALBUMIN 3.7 (L) 06/01/2020   CALCIUM 8.3 (L) 06/01/2020   ANIONGAP 9 07/18/2018   Lab Results  Component Value Date   CHOL 165 06/01/2020   Lab Results  Component Value Date   HDL 42 06/01/2020   Lab Results  Component Value Date    LDLCALC 96 06/01/2020   Lab Results  Component Value Date   TRIG 152 (H) 06/01/2020   Lab Results  Component Value Date   CHOLHDL 3.9 06/01/2020   Lab Results  Component Value Date   HGBA1C 6.6 (A) 06/01/2020   HGBA1C 6.6 06/01/2020   HGBA1C 6.6 (A) 06/01/2020   HGBA1C 6.6 06/01/2020    Assessment & Plan:   1. Hypertensive urgency Blood pressures are elevated today. Clonidine 0.2 mg  given to patient in office and blood pressures mildly decrease, but still  remain elevated. We referred patient to ED via ambulance at this time. Patient refused and signed AMA form at discharge. Patient will continue to take medications as prescribed, to decrease high sodium intake, excessive alcohol intake, increase potassium intake, smoking cessation, and increase physical activity of at least 30 minutes of cardio activity daily. Patient will continue to follow Heart Healthy or DASH diet. Patient denies severe headaches, confusion, seizures, double vision, and blurred vision, nausea and vomiting. Patient will report to ED if patient begins experiencing these symptoms. Patient verbalized understanding.   - cloNIDine (CATAPRES) tablet 0.2 mg  2. Hypertension, unspecified type - cloNIDine (CATAPRES) tablet 0.2 mg  3. History of PCOS  4. Follow up She will follow up in 1 month.   Meds ordered this encounter  Medications  . cloNIDine (CATAPRES) tablet 0.2 mg    No orders of the defined types were placed in this encounter.   Referral Orders  No referral(s) requested today    Raliegh Ip, MSN, ANE, FNP-BC [facility] Patient Care Center/Internal Medicine/Sickle Cell Center [facility] [address] [place], [place] [address] [phone] fax  Problem List Items Addressed This Visit   None   Visit Diagnoses    Hypertensive urgency    -  Primary   Relevant Medications   cloNIDine (CATAPRES) tablet 0.2 mg (Start on 07/05/2020  9:30 PM)   Hypertension,  unspecified type       Relevant Medications   cloNIDine (CATAPRES) tablet 0.2 mg (Start on 07/05/2020  9:30 PM)   History of PCOS       Follow up          Meds ordered this encounter  Medications  . cloNIDine (CATAPRES) tablet 0.2 mg    Follow-up: No follow-ups on file.    Kallie Locks, FNP

## 2020-07-05 ENCOUNTER — Encounter: Payer: Self-pay | Admitting: Family Medicine

## 2020-07-05 MED ORDER — CLONIDINE HCL 0.2 MG PO TABS: 0.2000 mg | ORAL_TABLET | Freq: Once | ORAL | Status: DC

## 2020-07-23 ENCOUNTER — Ambulatory Visit (INDEPENDENT_AMBULATORY_CARE_PROVIDER_SITE_OTHER): Payer: Medicaid Other | Admitting: Obstetrics and Gynecology

## 2020-07-23 ENCOUNTER — Other Ambulatory Visit: Payer: Self-pay

## 2020-07-23 ENCOUNTER — Encounter: Payer: Self-pay | Admitting: Obstetrics and Gynecology

## 2020-07-23 ENCOUNTER — Other Ambulatory Visit (HOSPITAL_COMMUNITY)
Admission: RE | Admit: 2020-07-23 | Discharge: 2020-07-23 | Disposition: A | Discharge status: Home / Self Care | Payer: Medicaid Other | Source: Ambulatory Visit | Attending: Obstetrics and Gynecology | Admitting: Obstetrics and Gynecology

## 2020-07-23 VITALS — BP 158/121 | HR 98 | Wt 328.6 lb

## 2020-07-23 DIAGNOSIS — Z113 Encounter for screening for infections with a predominantly sexual mode of transmission: Secondary | ICD-10-CM | POA: Diagnosis present

## 2020-07-23 DIAGNOSIS — E119 Type 2 diabetes mellitus without complications: Secondary | ICD-10-CM | POA: Diagnosis not present

## 2020-07-23 DIAGNOSIS — Z01419 Encounter for gynecological examination (general) (routine) without abnormal findings: Secondary | ICD-10-CM | POA: Diagnosis present

## 2020-07-23 DIAGNOSIS — Z124 Encounter for screening for malignant neoplasm of cervix: Secondary | ICD-10-CM

## 2020-07-23 DIAGNOSIS — N926 Irregular menstruation, unspecified: Secondary | ICD-10-CM | POA: Insufficient documentation

## 2020-07-23 DIAGNOSIS — E039 Hypothyroidism, unspecified: Secondary | ICD-10-CM

## 2020-07-23 DIAGNOSIS — Z7189 Other specified counseling: Secondary | ICD-10-CM

## 2020-07-23 DIAGNOSIS — R946 Abnormal results of thyroid function studies: Secondary | ICD-10-CM

## 2020-07-23 DIAGNOSIS — I10 Essential (primary) hypertension: Secondary | ICD-10-CM

## 2020-07-23 DIAGNOSIS — Z23 Encounter for immunization: Secondary | ICD-10-CM | POA: Diagnosis not present

## 2020-07-23 DIAGNOSIS — E282 Polycystic ovarian syndrome: Secondary | ICD-10-CM

## 2020-07-23 MED ORDER — MEDROXYPROGESTERONE ACETATE 10 MG PO TABS: 10.0000 mg | ORAL_TABLET | Freq: Every day | ORAL | 6 refills | Status: DC

## 2020-07-23 MED ORDER — METFORMIN HCL 500 MG PO TABS: 500.0000 mg | ORAL_TABLET | Freq: Two times a day (BID) | ORAL | 5 refills | Status: DC

## 2020-07-23 NOTE — Progress Notes (Addendum)
GYNECOLOGY ANNUAL PREVENTATIVE CARE ENCOUNTER NOTE  Subjective:   Christina Bailey is a 30 y.o. G0P0000 adult here for a annual gynecologic exam. Current complaints: here for follow up for possible PCOS. Has never been formally diagnosed. Can go a year or two without periods. Had period in January 2022, last period before that was 06/2019. Was two years before that. Last period was about a week, one before that lasted almost two months.    They would like to get pregnant at some point. Currently has female partner.  Forgot to take water pill today.   Denies abnormal vaginal discharge, pelvic pain, problems with intercourse or other gynecologic concerns. Accepts STI screen.   Gynecologic History Patient's last menstrual period was 06/14/2020. Contraception: same sex partner Last Pap: never had Last mammogram: n/a DEXA: has never had  Obstetric History OB History  Gravida Para Term Preterm AB Living  0 0 0 0 0 0  SAB IAB Ectopic Multiple Live Births  0 0 0 0 0    Past Medical History:  Diagnosis Date  . Anxiety   . GERD (gastroesophageal reflux disease)   . Hypocalcemia 05/2020  . Hypokalemia 05/2020  . PCOS (polycystic ovarian syndrome)   . Sickle cell trait (HCC)   . Vitamin D deficiency 05/2020    Past Surgical History:  Procedure Laterality Date  . concussion     2014; [employer] accident; received staples to head    Current Outpatient Medications on File Prior to Visit  Medication Sig Dispense Refill  . hydrochlorothiazide (HYDRODIURIL) 25 MG tablet Take 1 tablet (25 mg total) by mouth daily. 90 tablet 3   Current Facility-Administered Medications on File Prior to Visit  Medication Dose Route Frequency Provider Last Rate Last Admin  . cloNIDine (CATAPRES) tablet 0.2 mg  0.2 mg Oral Once Kallie Locks, FNP        No Known Allergies  Social History   Socioeconomic History  . Marital status: Single    Spouse name: Not on file  . Number of children:  Not on file  . Years of education: Not on file  . Highest education level: Not on file  Occupational History  . Not on file  Tobacco Use  . Smoking status: Never Smoker  . Smokeless tobacco: Never Used  Vaping Use  . Vaping Use: Never used  Substance and Sexual Activity  . Alcohol use: Not Currently  . Drug use: No  . Sexual activity: Yes    Birth control/protection: None  Other Topics Concern  . Not on file  Social History Narrative  . Not on file   Social Determinants of Health   Financial Resource Strain: Not on file  Food Insecurity: Not on file  Transportation Needs: Not on file  Physical Activity: Not on file  Stress: Not on file  Social Connections: Not on file  Intimate Partner Violence: Not on file    Family History  Problem Relation Age of Onset  . Healthy Mother   . Fibroids Mother   . Sickle cell anemia Father   . Hypertension Maternal Grandmother   . Diabetes Maternal Grandmother   . Hypertension Maternal Grandfather   . Thyroid disease Maternal Grandfather   . Sickle cell trait Paternal Grandmother   . Leukemia Maternal Aunt     The following portions of the patient's history were reviewed and updated as appropriate: allergies, current medications, past family history, past medical history, past social history, past surgical history and  problem list.  Review of Systems Pertinent items are noted in HPI.   Objective:  BP (!) 158/121   Pulse 98   Wt (!) 328 lb 9.6 oz (149.1 kg)   LMP 06/14/2020   BMI 54.68 kg/m  CONSTITUTIONAL: Well-developed, well-nourished adult in no acute distress.  HENT:  Normocephalic, atraumatic, External right and left ear normal. Oropharynx is clear and moist EYES: Conjunctivae and EOM are normal. Pupils are equal, round, and reactive to light. No scleral icterus.  NECK: Normal range of motion, supple, no masses.  Normal thyroid.  SKIN: Skin is warm and dry. No rash noted. Not diaphoretic. No erythema. No  pallor. NEUROLOGIC: Alert and oriented to person, place, and time. Normal reflexes, muscle tone coordination. No cranial nerve deficit noted. PSYCHIATRIC: Normal mood and affect. Normal behavior. Normal judgment and thought content. CARDIOVASCULAR: Normal heart rate noted RESPIRATORY: Effort normal, no problems with respiration noted. BREASTS: Symmetric in size. No masses, skin changes, nipple drainage, or lymphadenopathy. ABDOMEN: Soft, no distention noted.  No tenderness, rebound or guarding.  PELVIC: Normal appearing female external genitalia; normal appearing vaginal mucosa and cervix.  No abnormal discharge noted.  Pap smear obtained. Pelvic cultures obtained. Normal uterine size, no other palpable masses, no uterine or adnexal tenderness. MUSCULOSKELETAL: Normal range of motion. No tenderness.  No cyanosis, clubbing, or edema.  2+ distal pulses.  Exam done with chaperone present.   Assessment and Plan:   1. Pap smear for cervical cancer screening - Cytology - PAP( [place])  2. Annual exam Healthy exam  3. Routine screening for STI (sexually transmitted infection) - Cervicovaginal ancillary only - HIV antibody (with reflex) - Hepatitis B surface antigen - Hepatitis C antibody - RPR  4. Irregular periods Suspect PCOS given hirsutism, insulin resistance - will get labs today - reviewed importance of cycling to protect against uterine cancer - they are agreeable to start meds, will not give estrogen given weight and HTN - reviewed that it may be difficult to get pregnant, when they would like to try, return for discussion and may need fertility treatment at some point - US PELVIC COMPLETE WITH TRANSVAGINAL; Future  5. Primary hypertension - only on one med - referral to MD for management - Ambulatory referral to [facility]  6. Type 2 diabetes mellitus without complication, without long-term current use of insulin (HCC) - has never been told they have DM -  referral to FM - start metformin  7. PCOS (polycystic ovarian syndrome) - TSH - Luteinizing hormone - Follicle stimulating hormone - 17-Hydroxyprogesterone - DHEA-sulfate - Testosterone,Free and Total  8. Counseled about COVID-19 virus infection COVID-19 Vaccine Counseling: The patient was counseled on the potential benefits and lack of known risks of COVID vaccination, during today's visit. The patient's questions and concerns were addressed today, including safety of the vaccination and potential side effects as they have been published by ACOG and [facility]. The patient has been informed that there have not been any documented vaccine related injuries, deaths or birth defects to infant or mom after receiving the COVID-19 vaccine to date.. All patient questions were addressed during our visit today. The patient is still unsure of her decision for vaccination.   9. Hypothyroidism, unspecified type Elevated TSH in December Will repeat, suspect will be elevated and start synthroid with results   Will follow up results of pap smear/STI screen and manage accordingly. Encouraged improvement in diet and exercise.  COVID vaccine not UTD Accepts STI screen. Mammogram n/a Referral for  colonoscopy n/a DEXA not due based on age  Routine preventative health maintenance measures emphasized. Please refer to After Visit Summary for other counseling recommendations.     Baldemar Lenis, MD, [facility] Attending Center for [employer] [facility])

## 2020-07-24 LAB — HEPATITIS C ANTIBODY: Hep C Virus Ab: <0.1 s/co ratio (ref 0.0–0.9)

## 2020-07-24 LAB — HEPATITIS B SURFACE ANTIGEN: Hepatitis B Surface Ag: NEGATIVE

## 2020-07-24 LAB — HIV ANTIBODY (ROUTINE TESTING W REFLEX): HIV Screen 4th Generation wRfx: NONREACTIVE

## 2020-07-24 LAB — RPR: RPR Ser Ql: NONREACTIVE

## 2020-07-26 LAB — CERVICOVAGINAL ANCILLARY ONLY
Chlamydia: NEGATIVE
Comment: NEGATIVE
Comment: NEGATIVE
Comment: NORMAL
Neisseria Gonorrhea: NEGATIVE
Trichomonas: NEGATIVE

## 2020-07-26 LAB — CYTOLOGY - PAP: Diagnosis: NEGATIVE

## 2020-08-01 LAB — FOLLICLE STIMULATING HORMONE: FSH: 5.1 m[IU]/mL

## 2020-08-01 LAB — 17-HYDROXYPROGESTERONE: 17-Hydroxyprogesterone: 28 ng/dL

## 2020-08-01 LAB — TSH: TSH: 4.82 u[IU]/mL — ABNORMAL HIGH (ref 0.450–4.500)

## 2020-08-01 LAB — LUTEINIZING HORMONE: LH: 12 m[IU]/mL

## 2020-08-01 LAB — TESTOSTERONE,FREE AND TOTAL
Testosterone, Free: 1.9 pg/mL (ref 0.0–4.2)
Testosterone: 34 ng/dL (ref 13–71)

## 2020-08-01 LAB — DHEA-SULFATE: DHEA-SO4: 245 ug/dL (ref 84.8–378.0)

## 2020-08-03 ENCOUNTER — Inpatient Hospital Stay: Admission: RE | Admit: 2020-08-03 | Payer: Medicaid Other | Source: Ambulatory Visit

## 2020-08-03 MED ORDER — LEVOTHYROXINE SODIUM 25 MCG PO TABS: 25.0000 ug | ORAL_TABLET | Freq: Every day | ORAL | 3 refills | Status: DC

## 2020-08-03 NOTE — Addendum Note (Signed)
Addended by: Leroy Libman on: 08/03/2020 07:58 AM   Modules accepted: Orders

## 2021-03-08 ENCOUNTER — Emergency Department (HOSPITAL_COMMUNITY)
Admission: EM | Admit: 2021-03-08 | Discharge: 2021-03-08 | Disposition: A | Discharge status: Home / Self Care | Payer: Medicaid Other | Attending: Emergency Medicine | Admitting: Emergency Medicine

## 2021-03-08 ENCOUNTER — Encounter (HOSPITAL_COMMUNITY): Payer: Self-pay | Admitting: *Deleted

## 2021-03-08 DIAGNOSIS — I10 Essential (primary) hypertension: Secondary | ICD-10-CM | POA: Insufficient documentation

## 2021-03-08 DIAGNOSIS — H9201 Otalgia, right ear: Secondary | ICD-10-CM | POA: Insufficient documentation

## 2021-03-08 DIAGNOSIS — Z79899 Other long term (current) drug therapy: Secondary | ICD-10-CM | POA: Insufficient documentation

## 2021-03-08 DIAGNOSIS — Z8616 Personal history of COVID-19: Secondary | ICD-10-CM | POA: Insufficient documentation

## 2021-03-08 HISTORY — DX: Essential (primary) hypertension: I10

## 2021-03-08 NOTE — ED Triage Notes (Signed)
Pt complains of right ear pain x 2 days. No fever.

## 2021-03-08 NOTE — Discharge Instructions (Addendum)
You are seen in emergency department for feeling like there was some backed up wax in your ears.  There is no wax in either of your ears.  If you continue to have symptoms please return to your primary care provider as needed.

## 2021-03-08 NOTE — ED Provider Notes (Signed)
[facility]-EMERGENCY DEPT Provider Note   [name]: [id number] Arrival date & time: 03/08/21  5916     History No chief complaint on file.   Christina Bailey is a 30 y.o. adult.  With past medical history of hypertension, GERD who presents to the emergency department with "ear fullness."  She states that she feels like she has "earwax buildup."  States that this has been going on since yesterday.  Has not tried any interventions.  Nothing makes it better or worse.  She denies sinus symptoms, fever, ear pain.  HPI     Past Medical History:  Diagnosis Date   Anxiety    GERD (gastroesophageal reflux disease)    Hypertension    Hypocalcemia 05/2020   Hypokalemia 05/2020   PCOS (polycystic ovarian syndrome)    Sickle cell trait (HCC)    Vitamin D deficiency 05/2020    Patient Active Problem List   Diagnosis Date Noted   History of COVID-19 05/18/2020   Shortness of breath 05/18/2020    Past Surgical History:  Procedure Laterality Date   concussion     2014; [employer] accident; received staples to head     OB History     Gravida  0   Para  0   Term  0   Preterm  0   AB  0   Living  0      SAB  0   IAB  0   Ectopic  0   Multiple  0   Live Births  0           Family History  Problem Relation Age of Onset   Healthy Mother    Fibroids Mother    Sickle cell anemia Father    Hypertension Maternal Grandmother    Diabetes Maternal Grandmother    Hypertension Maternal Grandfather    Thyroid disease Maternal Grandfather    Sickle cell trait Paternal Grandmother    Leukemia Maternal Aunt     Social History   Tobacco Use   Smoking status: Never   Smokeless tobacco: Never  Vaping Use   Vaping Use: Never used  Substance Use Topics   Alcohol use: Not Currently   Drug use: No    Home Medications Prior to Admission medications   Medication Sig Start Date End Date Taking? Authorizing Provider  hydrochlorothiazide  (HYDRODIURIL) 25 MG tablet Take 1 tablet (25 mg total) by mouth daily. 06/01/20   Kallie Locks, FNP  levothyroxine (SYNTHROID) 25 MCG tablet Take 1 tablet (25 mcg total) by mouth daily before breakfast. 08/03/20   Conan Bowens, MD  medroxyPROGESTERone (PROVERA) 10 MG tablet Take 1 tablet (10 mg total) by mouth daily. Use for ten days, then repeat the beginning of the next month 07/23/20   Conan Bowens, MD  metFORMIN (GLUCOPHAGE) 500 MG tablet Take 1 tablet (500 mg total) by mouth 2 (two) times daily with a meal. Take 1 tablet (500 mg total) by mouth daily, then at the end of one week, add a second tablet to make it 500 mg twice daily. 07/23/20   Conan Bowens, MD    Allergies    Patient has no known allergies.  Review of Systems   Review of Systems  HENT:  Positive for ear pain.        Ear pain is most appropriate epic click, however this is more of an ear fullness without pain.  All other systems reviewed and are negative.  Physical Exam Updated Vital Signs BP (!) 188/146 (BP Location: Right Arm)   Pulse 92   Temp 98.7 F (37.1 C) (Oral)   Resp 18   LMP 02/22/2021   SpO2 95%   Physical Exam Vitals and nursing note reviewed.  Constitutional:      General: Christina Bergum "Ke" is not in acute distress.    Appearance: Christina Hatlestad "Ke" is not toxic-appearing.  HENT:     Head: Normocephalic and atraumatic.     Right Ear: Tympanic membrane, ear canal and external ear normal. There is no impacted cerumen.     Left Ear: Tympanic membrane, ear canal and external ear normal. There is no impacted cerumen.     Nose: Nose normal. No congestion.     Mouth/Throat:     Mouth: Mucous membranes are moist.     Pharynx: Oropharynx is clear. No posterior oropharyngeal erythema.  Eyes:     General: No scleral icterus.    Conjunctiva/sclera: Conjunctivae normal.  Pulmonary:     Effort: Pulmonary effort is normal. No respiratory distress.  Lymphadenopathy:     Cervical: No cervical  adenopathy.  Skin:    General: Skin is warm and dry.     Findings: No rash.  Neurological:     General: No focal deficit present.     Mental Status: Christina Brabec "Ke" is alert and oriented to person, place, and time. Mental status is at baseline.     Cranial Nerves: No cranial nerve deficit.  Psychiatric:        Mood and Affect: Mood normal.        Behavior: Behavior normal.        Thought Content: Thought content normal.        Judgment: Judgment normal.    ED Results / Procedures / Treatments   Labs (all labs ordered are listed, but only abnormal results are displayed) Labs Reviewed - No data to display  EKG None  Radiology No results found.  Procedures Procedures   Medications Ordered in ED Medications - No data to display  ED Course  I have reviewed the triage vital signs and the nursing notes.  Pertinent labs & imaging results that were available during my care of the patient were reviewed by me and considered in my medical decision making (see chart for details).    MDM Rules/Calculators/A&P 30 year old female presents emergency department with feeling that her ear is full.  Physical exam is unremarkable.  Bilateral ear canals without impacted cerumen.  Both TMs have good cone of light and are not bulging or erythematous.  There is no evidence of mastoiditis.  Hearing is intact and equal bilaterally.  She has no other viral or bacterial upper respiratory symptoms.  She has globally no infectious symptoms or neurological symptoms that concern me for vestibular neuritis or occult intracranial abnormalities.  No medical management is indicated at this time.  I have discussed my findings with the patient.  She understands to follow-up with her primary care provider if she has any ongoing symptoms however no emergent management is indicated.  Her vital signs are stable although she has hypertension.  I have discussed with her following up with her primary care provider  about this.  I have answered all her questions.  She is safe for discharge at this time Final Clinical Impression(s) / ED Diagnoses Final diagnoses:  Right ear pain    Rx / DC Orders ED Discharge Orders     None  Cristopher Peru, PA-C 03/08/21 1751    Derwood Kaplan, MD 03/18/21 [phone]

## 2021-04-22 ENCOUNTER — Emergency Department (HOSPITAL_COMMUNITY)
Admission: EM | Admit: 2021-04-22 | Discharge: 2021-04-22 | Disposition: A | Discharge status: Home / Self Care | Payer: Medicaid Other | Attending: Emergency Medicine | Admitting: Emergency Medicine

## 2021-04-22 ENCOUNTER — Other Ambulatory Visit: Payer: Self-pay

## 2021-04-22 ENCOUNTER — Encounter (HOSPITAL_COMMUNITY): Payer: Self-pay | Admitting: Emergency Medicine

## 2021-04-22 DIAGNOSIS — Z8616 Personal history of COVID-19: Secondary | ICD-10-CM | POA: Insufficient documentation

## 2021-04-22 DIAGNOSIS — J069 Acute upper respiratory infection, unspecified: Secondary | ICD-10-CM | POA: Insufficient documentation

## 2021-04-22 DIAGNOSIS — I10 Essential (primary) hypertension: Secondary | ICD-10-CM | POA: Insufficient documentation

## 2021-04-22 DIAGNOSIS — Z79899 Other long term (current) drug therapy: Secondary | ICD-10-CM | POA: Insufficient documentation

## 2021-04-22 DIAGNOSIS — Z20822 Contact with and (suspected) exposure to covid-19: Secondary | ICD-10-CM | POA: Insufficient documentation

## 2021-04-22 DIAGNOSIS — Z28310 Unvaccinated for covid-19: Secondary | ICD-10-CM | POA: Insufficient documentation

## 2021-04-22 LAB — RESP PANEL BY RT-PCR (FLU A&B, COVID) ARPGX2
Influenza A by PCR: NEGATIVE
Influenza B by PCR: NEGATIVE
SARS Coronavirus 2 by RT PCR: NEGATIVE

## 2021-04-22 MED ORDER — AMLODIPINE BESYLATE 10 MG PO TABS: 10.0000 mg | ORAL_TABLET | Freq: Every day | ORAL | 0 refills | Status: DC

## 2021-04-22 NOTE — Discharge Instructions (Signed)
You likely have a viral illness.  This should be treated symptomatically. Use Tylenol or ibuprofen as needed for headache, fevers, or body aches. Use cough drops/syrup as needed.  Make sure you stay well-hydrated with water. Wash your hands frequently to prevent spread of infection. Follow-up with your primary care doctor in 1 week if your symptoms are not improving. Return to the emergency room if you develop chest pain, difficulty breathing, or any new or worsening symptoms.  Take amlodipine daily.  Eat a low salt diet to help with blood pressure (information about this in the paperwork).  It is very important you follow up with your primary care doctor for further management of your blood pressure.

## 2021-04-22 NOTE — ED Triage Notes (Addendum)
Pt c/o congestion, sore throat, cough, and headache x 3 days. Denies fevers, nv, cp, shob. Pt also concerned about her elevated BP. Pt has hx of htn and non compliance with meds.

## 2021-04-22 NOTE — ED Provider Notes (Signed)
[facility]   [name]: [id number] Arrival date & time: 04/22/21  2135     History Chief Complaint  Patient presents with   Sore Throat   Nasal Congestion   Headache    Christina Bailey is a 30 y.o. adult presenting for evaluation of sore throat, nasal congestion, cough, headache.  Patient states they have had symptoms for about 3 days.  They report cough is nonproductive.  They report a recent contact that was positive for flu.  They are unvaccinated for flu.  They deny chest pain, difficulty breathing, nausea, vomiting, abdominal pain, urinary symptoms, abnormal bowel movements. Additionally, they report a history of high blood pressure ever since having COVID a year ago.  They were on HCTZ, however have not taken in the past several months.  HPI     Past Medical History:  Diagnosis Date   Anxiety    GERD (gastroesophageal reflux disease)    Hypertension    Hypocalcemia 05/2020   Hypokalemia 05/2020   PCOS (polycystic ovarian syndrome)    Sickle cell trait (HCC)    Vitamin D deficiency 05/2020    Patient Active Problem List   Diagnosis Date Noted   History of COVID-19 05/18/2020   Shortness of breath 05/18/2020    Past Surgical History:  Procedure Laterality Date   concussion     2014; [employer] accident; received staples to head     OB History     Gravida  0   Para  0   Term  0   Preterm  0   AB  0   Living  0      SAB  0   IAB  0   Ectopic  0   Multiple  0   Live Births  0           Family History  Problem Relation Age of Onset   Healthy Mother    Fibroids Mother    Sickle cell anemia Father    Hypertension Maternal Grandmother    Diabetes Maternal Grandmother    Hypertension Maternal Grandfather    Thyroid disease Maternal Grandfather    Sickle cell trait Paternal Grandmother    Leukemia Maternal Aunt     Social History   Tobacco Use   Smoking status: Never   Smokeless  tobacco: Never  Vaping Use   Vaping Use: Never used  Substance Use Topics   Alcohol use: Not Currently   Drug use: No    Home Medications Prior to Admission medications   Medication Sig Start Date End Date Taking? Authorizing Provider  amLODipine (NORVASC) 10 MG tablet Take 1 tablet (10 mg total) by mouth daily. 04/22/21  Yes Breklyn Fabrizio, PA-C  hydrochlorothiazide (HYDRODIURIL) 25 MG tablet Take 1 tablet (25 mg total) by mouth daily. 06/01/20   Kallie Locks, FNP  levothyroxine (SYNTHROID) 25 MCG tablet Take 1 tablet (25 mcg total) by mouth daily before breakfast. 08/03/20   Conan Bowens, MD  medroxyPROGESTERone (PROVERA) 10 MG tablet Take 1 tablet (10 mg total) by mouth daily. Use for ten days, then repeat the beginning of the next month 07/23/20   Conan Bowens, MD  metFORMIN (GLUCOPHAGE) 500 MG tablet Take 1 tablet (500 mg total) by mouth 2 (two) times daily with a meal. Take 1 tablet (500 mg total) by mouth daily, then at the end of one week, add a second tablet to make it 500 mg twice daily. 07/23/20  Conan Bowens, MD    Allergies    Patient has no known allergies.  Review of Systems   Review of Systems  HENT:  Positive for congestion and sore throat.   Respiratory:  Positive for cough.   All other systems reviewed and are negative.  Physical Exam Updated Vital Signs BP (!) 187/136 (BP Location: Left Arm)   Pulse (!) 103   Temp 98 F (36.7 C) (Oral)   Resp 16   SpO2 98%   Physical Exam Vitals and nursing note reviewed.  Constitutional:      General: Christina [facility] "Ke" is not in acute distress.    Appearance: Normal appearance. [facility] "Ke" is obese.     Comments: Resting in the bed in NAD  HENT:     Head: Normocephalic and atraumatic.     Nose: Mucosal edema and congestion present.     Mouth/Throat:     Pharynx: Oropharynx is clear. Uvula midline. No oropharyngeal exudate or posterior oropharyngeal erythema.     Tonsils: No tonsillar exudate  or tonsillar abscesses.  Eyes:     Conjunctiva/sclera: Conjunctivae normal.     Pupils: Pupils are equal, round, and reactive to light.  Cardiovascular:     Rate and Rhythm: Normal rate and regular rhythm.     Pulses: Normal pulses.  Pulmonary:     Effort: Pulmonary effort is normal. No respiratory distress.     Breath sounds: Normal breath sounds. No wheezing.     Comments: Speaking in full sentences.  Clear lung sounds in all fields. Abdominal:     General: There is no distension.     Palpations: Abdomen is soft.     Tenderness: There is no abdominal tenderness.  Musculoskeletal:        General: Normal range of motion.     Cervical back: Normal range of motion and neck supple.  Skin:    General: Skin is warm and dry.     Capillary Refill: Capillary refill takes less than 2 seconds.  Neurological:     Mental Status: Christina [facility] "Ke" is alert and oriented to person, place, and time.  Psychiatric:        Mood and Affect: Mood and affect normal.        Speech: Speech normal.        Behavior: Behavior normal.    ED Results / Procedures / Treatments   Labs (all labs ordered are listed, but only abnormal results are displayed) Labs Reviewed  RESP PANEL BY RT-PCR (FLU A&B, COVID) [id number]    EKG None  Radiology No results found.  Procedures Procedures   Medications Ordered in ED Medications - No data to display  ED Course  I have reviewed the triage vital signs and the nursing notes.  Pertinent labs & imaging results that were available during my care of the patient were reviewed by me and considered in my medical decision making (see chart for details).    MDM Rules/Calculators/A&P                           Patient presenting with 3 day h/o URI symptoms.  Physical exam reassuring, patient is afebrile and appears nontoxic.  Pulmonary exam reassuring.  Doubt pneumonia, strep, other bacterial infection, or peritonsillar abscess.  Likely viral URI.  COVID and flu  test pending.  Will treat symptomatically.   Additionally, regarding patient's blood pressure, will start him on amlodipine.  Discussed  importance of close follow-up with PCP.  As pt is having no sxs, do not feel they need emergent work up in the ed today.  At this time, patient appears safe for discharge.  Return precautions given.  Patient states they understand and agree to plan.  Final Clinical Impression(s) / ED Diagnoses Final diagnoses:  Viral URI with cough  Asymptomatic hypertension    Rx / DC Orders ED Discharge Orders          Ordered    amLODipine (NORVASC) 10 MG tablet  Daily        04/22/21 2216             Guled Gahan, Jeanette Caprice, PA-C 04/22/21 2236    Virgina Norfolk, DO 04/22/21 2252

## 2021-04-25 ENCOUNTER — Other Ambulatory Visit: Payer: Self-pay

## 2021-04-25 ENCOUNTER — Encounter: Payer: Self-pay | Admitting: Nurse Practitioner

## 2021-04-25 ENCOUNTER — Ambulatory Visit (INDEPENDENT_AMBULATORY_CARE_PROVIDER_SITE_OTHER): Payer: Self-pay | Admitting: Nurse Practitioner

## 2021-04-25 VITALS — BP 186/107 | HR 118 | Temp 99.3°F | Ht 66.0 in | Wt 315.1 lb

## 2021-04-25 DIAGNOSIS — I10 Essential (primary) hypertension: Secondary | ICD-10-CM

## 2021-04-25 DIAGNOSIS — R000 Tachycardia, unspecified: Secondary | ICD-10-CM

## 2021-04-25 DIAGNOSIS — Z0000 Encounter for general adult medical examination without abnormal findings: Secondary | ICD-10-CM

## 2021-04-25 MED ORDER — METOPROLOL SUCCINATE ER 25 MG PO TB24: 25.0000 mg | ORAL_TABLET | Freq: Every day | ORAL | 0 refills | Status: DC
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
## 2021-04-25 NOTE — Patient Instructions (Signed)
You were seen today in the [facility] for establish care and hypertension . Labs were collected, results will be available via MyChart or, if abnormal, you will be contacted by clinic staff. You were prescribed medications, please take as directed. Please follow up in 1 mth for reevaluation.

## 2021-04-26 LAB — CBC WITH DIFFERENTIAL/PLATELET
Basophils Absolute: 0.1 10*3/uL (ref 0.0–0.2)
Basos: 0 %
EOS (ABSOLUTE): 0.2 10*3/uL (ref 0.0–0.4)
Eos: 1 %
Hematocrit: 38.8 % (ref 34.0–46.6)
Hemoglobin: 12.4 g/dL (ref 11.1–15.9)
Immature Grans (Abs): 0 10*3/uL (ref 0.0–0.1)
Immature Granulocytes: 0 %
Lymphocytes Absolute: 5 10*3/uL — ABNORMAL HIGH (ref 0.7–3.1)
Lymphs: 31 %
MCH: 21.1 pg — ABNORMAL LOW (ref 26.6–33.0)
MCHC: 32 g/dL (ref 31.5–35.7)
MCV: 66 fL — ABNORMAL LOW (ref 79–97)
Monocytes Absolute: 0.8 10*3/uL (ref 0.1–0.9)
Monocytes: 5 %
Neutrophils Absolute: 10.3 10*3/uL — ABNORMAL HIGH (ref 1.4–7.0)
Neutrophils: 63 %
Platelets: 543 10*3/uL — ABNORMAL HIGH (ref 150–450)
RBC: 5.89 x10E6/uL — ABNORMAL HIGH (ref 3.77–5.28)
RDW: 19.4 % — ABNORMAL HIGH (ref 11.7–15.4)
WBC: 16.3 10*3/uL — ABNORMAL HIGH (ref 3.4–10.8)

## 2021-04-26 LAB — LIPID PANEL
Chol/HDL Ratio: 5 ratio — ABNORMAL HIGH (ref 0.0–4.4)
Cholesterol, Total: 216 mg/dL — ABNORMAL HIGH (ref 100–199)
HDL: 43 mg/dL (ref >39)
LDL Chol Calc (NIH): 148 mg/dL — ABNORMAL HIGH (ref 0–99)
Triglycerides: 138 mg/dL (ref 0–149)
VLDL Cholesterol Cal: 25 mg/dL (ref 5–40)

## 2021-04-26 LAB — COMPREHENSIVE METABOLIC PANEL
ALT: 39 IU/L — ABNORMAL HIGH (ref 0–32)
AST: 29 IU/L (ref 0–40)
Albumin/Globulin Ratio: 1.3 (ref 1.2–2.2)
Albumin: 4.2 g/dL (ref 3.9–5.0)
Alkaline Phosphatase: 106 IU/L (ref 44–121)
BUN/Creatinine Ratio: 10 (ref 9–23)
BUN: 9 mg/dL (ref 6–20)
Bilirubin Total: 0.4 mg/dL (ref 0.0–1.2)
CO2: 23 mmol/L (ref 20–29)
Calcium: 9 mg/dL (ref 8.7–10.2)
Chloride: 100 mmol/L (ref 96–106)
Creatinine, Ser: 0.91 mg/dL (ref 0.57–1.00)
Globulin, Total: 3.3 g/dL (ref 1.5–4.5)
Glucose: 97 mg/dL (ref 70–99)
Potassium: 4.3 mmol/L (ref 3.5–5.2)
Sodium: 137 mmol/L (ref 134–144)
Total Protein: 7.5 g/dL (ref 6.0–8.5)
eGFR: 88 mL/min/{1.73_m2} (ref >59)

## 2021-04-26 LAB — POCT GLYCOSYLATED HEMOGLOBIN (HGB A1C)
HbA1c POC (<> result, manual entry): 6.4 % (ref 4.0–5.6)
HbA1c, POC (controlled diabetic range): 6.4 % (ref 0.0–7.0)
HbA1c, POC (prediabetic range): 6.4 % (ref 5.7–6.4)
Hemoglobin A1C: 6.4 % — AB (ref 4.0–5.6)

## 2021-05-03 ENCOUNTER — Other Ambulatory Visit: Payer: Self-pay

## 2021-05-25 ENCOUNTER — Ambulatory Visit: Payer: Self-pay | Admitting: Nurse Practitioner

## 2021-06-13 ENCOUNTER — Ambulatory Visit: Payer: Medicaid Other | Admitting: Nurse Practitioner

## 2021-06-13 ENCOUNTER — Other Ambulatory Visit: Payer: Self-pay | Admitting: Nurse Practitioner

## 2021-06-13 ENCOUNTER — Other Ambulatory Visit: Payer: Self-pay

## 2021-06-13 VITALS — BP 161/97 | HR 92

## 2021-06-13 DIAGNOSIS — I10 Essential (primary) hypertension: Secondary | ICD-10-CM

## 2021-06-13 MED ORDER — AMLODIPINE BESYLATE 10 MG PO TABS: 10.0000 mg | ORAL_TABLET | Freq: Every day | ORAL | 2 refills | Status: DC

## 2021-06-13 MED ORDER — METOPROLOL SUCCINATE ER 25 MG PO TB24: 25.0000 mg | ORAL_TABLET | Freq: Every day | ORAL | 2 refills | Status: DC

## 2021-06-14 ENCOUNTER — Other Ambulatory Visit: Payer: Self-pay | Admitting: Nurse Practitioner

## 2021-06-14 DIAGNOSIS — I10 Essential (primary) hypertension: Secondary | ICD-10-CM

## 2021-06-14 MED ORDER — AMLODIPINE BESYLATE 10 MG PO TABS: 10.0000 mg | ORAL_TABLET | Freq: Every day | ORAL | 2 refills | Status: DC

## 2021-06-20 ENCOUNTER — Ambulatory Visit (HOSPITAL_BASED_OUTPATIENT_CLINIC_OR_DEPARTMENT_OTHER): Payer: Self-pay | Attending: Nurse Practitioner | Admitting: Internal Medicine

## 2021-06-20 ENCOUNTER — Other Ambulatory Visit: Payer: Self-pay

## 2021-06-20 VITALS — Ht 65.0 in | Wt 315.0 lb

## 2021-06-20 DIAGNOSIS — Z6841 Body Mass Index (BMI) 40.0 and over, adult: Secondary | ICD-10-CM | POA: Insufficient documentation

## 2021-06-20 DIAGNOSIS — G4733 Obstructive sleep apnea (adult) (pediatric): Secondary | ICD-10-CM | POA: Insufficient documentation

## 2021-06-26 NOTE — Procedures (Signed)
° ° ° °  Patient Name: Christina Bailey, Christina Bailey Study Date: 06/20/2021 Gender: Female D.O.B: [birth year] Age (years): 30 Referring Provider: Teena Dunk NP Height (inches): 65 Interpreting Physician: Baird Lyons MD, ABSM Weight (lbs): 315 RPSGT: Carolin Coy BMI: 52 MRN: [id number] Neck Size: 17.00 CLINICAL INFORMATION Sleep Study Type: NPSG Indication for sleep study: Hypertension, Morbid Obesity, Obesity Epworth Sleepiness Score: 3  SLEEP STUDY TECHNIQUE As per the AASM Manual for the Scoring of Sleep and Associated Events v2.3 (April 2016) with a hypopnea requiring 4% desaturations.  The channels recorded and monitored were frontal, central and occipital EEG, electrooculogram (EOG), submentalis EMG (chin), nasal and oral airflow, thoracic and abdominal wall motion, anterior tibialis EMG, snore microphone, electrocardiogram, and pulse oximetry.  MEDICATIONS Medications self-administered by patient taken the night of the study : none reported  SLEEP ARCHITECTURE The study was initiated at 10:09:20 PM and ended at 4:10:30 AM.  Sleep onset time was 87.1 minutes and the sleep efficiency was 68.8%%. The total sleep time was 248.5 minutes.  Stage REM latency was 83.5 minutes.  The patient spent 15.1%% of the night in stage N1 sleep, 74.8%% in stage N2 sleep, 0.0%% in stage N3 and 10.1% in REM.  Alpha intrusion was absent.  Supine sleep was 34.41%.  RESPIRATORY PARAMETERS The overall apnea/hypopnea index (AHI) was 38.9 per hour. There were 5 total apneas, including 5 obstructive, 0 central and 0 mixed apneas. There were 156 hypopneas and 101 RERAs.  The AHI during Stage REM sleep was 74.4 per hour.  AHI while supine was 75.1 per hour.  The mean oxygen saturation was 95.8%. The minimum SpO2 during sleep was 76.0%.  moderate snoring was noted during this study.  CARDIAC DATA The 2 lead EKG demonstrated sinus rhythm. The mean heart rate was 90.4 beats per minute. Other EKG  findings include: None.  LEG MOVEMENT DATA The total PLMS were 0 with a resulting PLMS index of 0.0. Associated arousal with leg movement index was 0.0 .  IMPRESSIONS - Severe obstructive sleep apnea occurred during this study (AHI = 38.9/h). - Moderate oxygen desaturation was noted during this study (Min O2 = 76.0%). Mean 95.8% - The patient snored with moderate snoring volume. - No cardiac abnormalities were noted during this study. - Clinically significant periodic limb movements did not occur during sleep. No significant associated arousals.  DIAGNOSIS - Obstructive Sleep Apnea (G47.33)  RECOMMENDATIONS - Suggest CPAP titration sleep study or autopap. Other options would be based on clinical judgment. - Be careful with alcohol, sedatives and other CNS depressants that may worsen sleep apnea and disrupt normal sleep architecture. - Sleep hygiene should be reviewed to assess factors that may improve sleep quality. - Weight management and regular exercise should be initiated or continued if appropriate.  [Electronically signed] 06/26/2021 11:31 AM  Baird Lyons MD, ABSM Diplomate, American Board of Sleep Medicine   NPI: [id number]                        [place] [place] [facility]  ELECTRONICALLY SIGNED ON:  06/26/2021, 11:29 AM [place] PH: [phone]   FX: [phone] [place]

## 2021-06-27 ENCOUNTER — Other Ambulatory Visit: Payer: Self-pay | Admitting: Nurse Practitioner

## 2021-06-27 DIAGNOSIS — G4733 Obstructive sleep apnea (adult) (pediatric): Secondary | ICD-10-CM

## 2021-07-12 ENCOUNTER — Telehealth: Payer: Self-pay

## 2021-07-12 ENCOUNTER — Other Ambulatory Visit: Payer: Self-pay | Admitting: Nurse Practitioner

## 2021-07-12 DIAGNOSIS — G4733 Obstructive sleep apnea (adult) (pediatric): Secondary | ICD-10-CM

## 2021-07-15 NOTE — Telephone Encounter (Signed)
No additional notes required  

## 2021-07-25 ENCOUNTER — Other Ambulatory Visit: Payer: Self-pay

## 2021-07-26 MED ORDER — POTASSIUM CHLORIDE CRYS ER 20 MEQ PO TBCR: 20.0000 meq | EXTENDED_RELEASE_TABLET | Freq: Every day | ORAL | 0 refills | Status: DC

## 2021-08-06 ENCOUNTER — Other Ambulatory Visit: Payer: Self-pay | Admitting: Obstetrics and Gynecology

## 2021-08-15 ENCOUNTER — Encounter (HOSPITAL_BASED_OUTPATIENT_CLINIC_OR_DEPARTMENT_OTHER): Payer: Medicaid Other | Admitting: Internal Medicine

## 2021-09-20 ENCOUNTER — Encounter (HOSPITAL_BASED_OUTPATIENT_CLINIC_OR_DEPARTMENT_OTHER): Payer: Medicaid Other | Admitting: Internal Medicine

## 2021-10-24 ENCOUNTER — Ambulatory Visit: Payer: Self-pay | Admitting: Nurse Practitioner

## 2021-11-04 ENCOUNTER — Encounter (HOSPITAL_COMMUNITY): Payer: Self-pay

## 2021-11-04 ENCOUNTER — Emergency Department (HOSPITAL_COMMUNITY): Payer: Self-pay

## 2021-11-04 ENCOUNTER — Emergency Department (HOSPITAL_COMMUNITY)
Admission: EM | Admit: 2021-11-04 | Discharge: 2021-11-05 | Disposition: A | Discharge status: Home / Self Care | Payer: Self-pay | Attending: Emergency Medicine | Admitting: Emergency Medicine

## 2021-11-04 ENCOUNTER — Other Ambulatory Visit: Payer: Self-pay

## 2021-11-04 DIAGNOSIS — I10 Essential (primary) hypertension: Secondary | ICD-10-CM | POA: Insufficient documentation

## 2021-11-04 DIAGNOSIS — Z79899 Other long term (current) drug therapy: Secondary | ICD-10-CM | POA: Insufficient documentation

## 2021-11-04 DIAGNOSIS — Z20822 Contact with and (suspected) exposure to covid-19: Secondary | ICD-10-CM | POA: Insufficient documentation

## 2021-11-04 DIAGNOSIS — D72829 Elevated white blood cell count, unspecified: Secondary | ICD-10-CM | POA: Insufficient documentation

## 2021-11-04 LAB — CBC
HCT: 39.9 % (ref 36.0–46.0)
Hemoglobin: 12.2 g/dL (ref 12.0–15.0)
MCH: 21.5 pg — ABNORMAL LOW (ref 26.0–34.0)
MCHC: 30.6 g/dL (ref 30.0–36.0)
MCV: 70.2 fL — ABNORMAL LOW (ref 80.0–100.0)
Platelets: 528 10*3/uL — ABNORMAL HIGH (ref 150–400)
RBC: 5.68 MIL/uL — ABNORMAL HIGH (ref 3.87–5.11)
RDW: 19 % — ABNORMAL HIGH (ref 11.5–15.5)
WBC: 18.4 10*3/uL — ABNORMAL HIGH (ref 4.0–10.5)
nRBC: 0 % (ref 0.0–0.2)

## 2021-11-04 LAB — BASIC METABOLIC PANEL
Anion gap: 9 (ref 5–15)
BUN: 10 mg/dL (ref 6–20)
CO2: 24 mmol/L (ref 22–32)
Calcium: 8.9 mg/dL (ref 8.9–10.3)
Chloride: 104 mmol/L (ref 98–111)
Creatinine, Ser: 0.8 mg/dL (ref 0.44–1.00)
GFR, Estimated: >60 mL/min (ref >60)
Glucose, Bld: 135 mg/dL — ABNORMAL HIGH (ref 70–99)
Potassium: 4 mmol/L (ref 3.5–5.1)
Sodium: 137 mmol/L (ref 135–145)

## 2021-11-04 LAB — CBG MONITORING, ED: Glucose-Capillary: 118 mg/dL — ABNORMAL HIGH (ref 70–99)

## 2021-11-04 NOTE — ED Triage Notes (Addendum)
Patient said she has hypertension. She is prescribed medication but not taking it. She said she tried to eat healthier. She also ran out of her blood pressure medication. Has a headache today with nausea.

## 2021-11-04 NOTE — ED Provider Triage Note (Signed)
Emergency Medicine Provider Triage Evaluation Note  Christina Bailey , a 31 y.o. adult  was evaluated in triage.  Pt complains of elevated blood pressure, headache, feeling of blurry vision, nausea, vomiting, migraine symptoms.  Patient reports they have been out of their blood pressure medication for around a week.  They do also endorse some tightness in the chest although they endorse that they do some heavy lifting at work and think that it may be related to this.  They deny any persistent chest pain, chest pain related to exertion.  Review of Systems  Positive: Chest tightness, headache, blurry vision Negative: Chest pain, shob  Physical Exam  BP (!) 163/120 (BP Location: Right Arm)   Pulse (!) 107   Temp 97.7 F (36.5 C) (Oral)   Resp 18   Ht 5\' 5"  (1.651 m)   Wt (!) 142.9 kg   SpO2 97%   BMI 52.42 kg/m  Gen:   Awake, no distress   Resp:  Normal effort  MSK:   Moves extremities without difficulty  Other:  Cranial nerves II through XII grossly intact.  Intact finger-nose, intact heel-to-shin.  Romberg negative, gait normal.  Alert and oriented x3.  Moves all 4 limbs spontaneously, normal coordination.  No pronator drift.  Intact strength 5 out of 5 bilateral upper and lower extremities.   Medical Decision Making  Medically screening exam initiated at 8:33 PM.  Appropriate orders placed.  Christina Bailey was informed that the remainder of the evaluation will be completed by another provider, this initial triage assessment does not replace that evaluation, and the importance of remaining in the ED until their evaluation is complete.  Workup initiated   Christina Bailey 11/04/21 2034

## 2021-11-05 LAB — URINALYSIS, ROUTINE W REFLEX MICROSCOPIC
Bilirubin Urine: NEGATIVE
Glucose, UA: NEGATIVE mg/dL
Hgb urine dipstick: NEGATIVE
Ketones, ur: 5 mg/dL — AB
Nitrite: NEGATIVE
Protein, ur: 30 mg/dL — AB
Specific Gravity, Urine: 1.016 (ref 1.005–1.030)
pH: 7 (ref 5.0–8.0)

## 2021-11-05 LAB — HEPATIC FUNCTION PANEL
ALT: 36 U/L (ref 0–44)
AST: 68 U/L — ABNORMAL HIGH (ref 15–41)
Albumin: 3.5 g/dL (ref 3.5–5.0)
Alkaline Phosphatase: 74 U/L (ref 38–126)
Bilirubin, Direct: 0.7 mg/dL — ABNORMAL HIGH (ref 0.0–0.2)
Indirect Bilirubin: 0.9 mg/dL (ref 0.3–0.9)
Total Bilirubin: 1.6 mg/dL — ABNORMAL HIGH (ref 0.3–1.2)
Total Protein: 7.9 g/dL (ref 6.5–8.1)

## 2021-11-05 LAB — SARS CORONAVIRUS 2 BY RT PCR: SARS Coronavirus 2 by RT PCR: NEGATIVE

## 2021-11-05 LAB — TROPONIN I (HIGH SENSITIVITY)
Troponin I (High Sensitivity): 3 ng/L (ref <18)
Troponin I (High Sensitivity): 3 ng/L (ref <18)

## 2021-11-05 LAB — PREGNANCY, URINE: Preg Test, Ur: NEGATIVE

## 2021-11-05 MED ORDER — METOPROLOL SUCCINATE ER 50 MG PO TB24: 25.0000 mg | ORAL_TABLET | Freq: Every day | ORAL | Status: DC

## 2021-11-05 MED ORDER — AMLODIPINE BESYLATE 5 MG PO TABS
10.0000 mg | ORAL_TABLET | Freq: Once | ORAL | Status: AC
  Administered 2021-11-05: 10 mg via ORAL
  Filled 2021-11-05: qty 2

## 2021-11-05 MED ORDER — LABETALOL HCL 5 MG/ML IV SOLN
10.0000 mg | Freq: Once | INTRAVENOUS | Status: AC
Start: 2021-11-05 — End: 2021-11-05
  Administered 2021-11-05: 10 mg via INTRAVENOUS
  Filled 2021-11-05: qty 4

## 2021-11-05 MED ORDER — ACETAMINOPHEN 325 MG PO TABS
650.0000 mg | ORAL_TABLET | Freq: Once | ORAL | Status: AC
Start: 2021-11-05 — End: 2021-11-05
  Administered 2021-11-05: 650 mg via ORAL
  Filled 2021-11-05: qty 2

## 2021-11-05 MED ORDER — LACTATED RINGERS IV BOLUS
1000.0000 mL | Freq: Once | INTRAVENOUS | Status: AC
  Administered 2021-11-05: 1000 mL via INTRAVENOUS

## 2021-11-05 MED ORDER — ONDANSETRON 4 MG PO TBDP
4.0000 mg | ORAL_TABLET | Freq: Once | ORAL | Status: AC
Start: 2021-11-05 — End: 2021-11-05
  Administered 2021-11-05: 4 mg via ORAL
  Filled 2021-11-05: qty 1

## 2021-11-05 NOTE — ED Provider Notes (Signed)
[facility]   [name]: [id number] Arrival date & time: 11/04/21  2000     History  Chief Complaint  Patient presents with   Hypertension    Christina Bailey is a 31 y.o. nonbinary adult with a uterus and ovaries who presents with concern for headache, nausea, and mild chest tightness today as well as decreased appetite and fatigue.  Patient states that they feel they have been "coming down with something" for the last few days, however checked her blood pressure today and found it to be quite high prompting ED visit.  Patient is usually on Toprol-XL 25 mg and amlodipine 10 mg daily for blood pressure control, however has been without this medication for a few weeks.  They did call in a refill yesterday, however have not gone to the pharmacy to pick it up.  Denies any chest pain, shortness of breath, palpitations, sudden severe headache, blurry or double vision, changes in urination.  Subjective fevers at home.  I personally reviewed this patient's medical records.  In addition to the above listed patient has history of PCOS, vitamin D deficiency, anxiety, and GERD.  Presents with their partner at the bedside. LMP <1 month ago.  HPI     Home Medications Prior to Admission medications   Medication Sig Start Date End Date Taking? Authorizing Provider  amLODipine (NORVASC) 10 MG tablet Take 1 tablet (10 mg total) by mouth daily. 06/14/21 11/05/21 Yes Passmore, Enid Derry I, NP  levothyroxine (SYNTHROID) 25 MCG tablet TAKE 1 TABLET(25 MCG) BY MOUTH DAILY BEFORE BREAKFAST 08/08/21  Yes Reva Bores, MD  metFORMIN (GLUCOPHAGE) 500 MG tablet TAKE 1 TABLET BY MOUTH TWICE DAILY 08/08/21  Yes Reva Bores, MD  metoprolol succinate (TOPROL-XL) 25 MG 24 hr tablet Take 1 tablet (25 mg total) by mouth daily. 06/13/21 11/05/21 Yes Passmore, Enid Derry I, NP  potassium chloride SA (KLOR-CON M) 20 MEQ tablet Take 1 tablet (20 mEq total) by mouth daily. Patient not taking:  Reported on 11/05/2021 07/26/21   Orion Crook I, NP      Allergies    Patient has no known allergies.    Review of Systems   Review of Systems  Constitutional:  Positive for appetite change, chills, fatigue and fever.  HENT: Negative.    Eyes:  Negative for photophobia, pain, redness and visual disturbance.  Respiratory:  Positive for chest tightness. Negative for cough and shortness of breath.   Cardiovascular: Negative.   Gastrointestinal:  Positive for nausea. Negative for abdominal pain, diarrhea and vomiting.  Genitourinary: Negative.   Musculoskeletal:  Positive for myalgias.  Skin: Negative.   Neurological:  Positive for light-headedness and headaches. Negative for dizziness.   Physical Exam Updated Vital Signs BP (!) 176/101   Pulse 85   Temp 98.3 F (36.8 C) (Oral)   Resp 18   Ht 5\' 5"  (1.651 m)   Wt (!) 142.9 kg   SpO2 97%   BMI 52.42 kg/m  Physical Exam Vitals and nursing note reviewed.  Constitutional:      Appearance: Christina Thiem "Ke" is obese. Christina Thivierge "Ke" is not ill-appearing or toxic-appearing.  HENT:     Head: Normocephalic and atraumatic.     Nose: Nose normal.     Mouth/Throat:     Mouth: Mucous membranes are moist.     Pharynx: No oropharyngeal exudate or posterior oropharyngeal erythema.  Eyes:     General:        Right eye: No  discharge.        Left eye: No discharge.     Extraocular Movements: Extraocular movements intact.     Conjunctiva/sclera: Conjunctivae normal.     Pupils: Pupils are equal, round, and reactive to light.  Cardiovascular:     Rate and Rhythm: Normal rate and regular rhythm.     Pulses: Normal pulses.     Heart sounds: Normal heart sounds. No murmur heard. Pulmonary:     Effort: Pulmonary effort is normal. No respiratory distress.     Breath sounds: Normal breath sounds. No wheezing or rales.  Abdominal:     General: Bowel sounds are normal. There is no distension.     Palpations: Abdomen is soft.      Tenderness: There is no abdominal tenderness. There is no right CVA tenderness, left CVA tenderness, guarding or rebound.  Musculoskeletal:        General: No deformity.     Cervical back: Neck supple.     Right lower leg: No edema.     Left lower leg: No edema.  Skin:    General: Skin is warm and dry.     Capillary Refill: Capillary refill takes less than 2 seconds.  Neurological:     General: No focal deficit present.     Mental Status: Christina Code "Ke" is alert and oriented to person, place, and time. Mental status is at baseline.  Psychiatric:        Mood and Affect: Mood normal.    ED Results / Procedures / Treatments   Labs (all labs ordered are listed, but only abnormal results are displayed) Labs Reviewed  CBC - Abnormal; Notable for the following components:      Result Value   WBC 18.4 (*)    RBC 5.68 (*)    MCV 70.2 (*)    MCH 21.5 (*)    RDW 19.0 (*)    Platelets 528 (*)    All other components within normal limits  BASIC METABOLIC PANEL - Abnormal; Notable for the following components:   Glucose, Bld 135 (*)    All other components within normal limits  HEPATIC FUNCTION PANEL - Abnormal; Notable for the following components:   AST 68 (*)    Total Bilirubin 1.6 (*)    Bilirubin, Direct 0.7 (*)    All other components within normal limits  URINALYSIS, ROUTINE W REFLEX MICROSCOPIC - Abnormal; Notable for the following components:   APPearance CLOUDY (*)    Ketones, ur 5 (*)    Protein, ur 30 (*)    Leukocytes,Ua SMALL (*)    Bacteria, UA RARE (*)    All other components within normal limits  CBG MONITORING, ED - Abnormal; Notable for the following components:   Glucose-Capillary 118 (*)    All other components within normal limits  SARS CORONAVIRUS 2 BY RT PCR  PREGNANCY, URINE  TROPONIN I (HIGH SENSITIVITY)  TROPONIN I (HIGH SENSITIVITY)    EKG EKG Interpretation  Date/Time:  Friday November 04 2021 22:11:10 EDT Ventricular Rate:  91 PR  Interval:  121 QRS Duration: 91 QT Interval:  378 QTC Calculation: 466 R Axis:   23 Text Interpretation: Sinus rhythm Nonspecific T abnormalities, lateral leads since last tracing no significant change Confirmed by Mancel Bale [phone]) on 11/04/2021 10:59:14 PM  Radiology DG Chest 2 View  Result Date: 11/04/2021 CLINICAL DATA:  Chest tightness.  Headache and nausea. EXAM: CHEST - 2 VIEW COMPARISON:  05/18/2020 FINDINGS: The heart size is  normal for technique.The cardiomediastinal contours are normal. Mild chronic eventration of right hemidiaphragm. Pulmonary vasculature is normal. No consolidation, pleural effusion, or pneumothorax. No acute osseous abnormalities are seen. IMPRESSION: No acute chest findings. Electronically Signed   By: Narda RutherfordMelanie  Sanford M.D.   On: 11/04/2021 21:20    Procedures Procedures    Medications Ordered in ED Medications  metoprolol succinate (TOPROL-XL) 24 hr tablet 25 mg (has no administration in time range)  acetaminophen (TYLENOL) tablet 650 mg (650 mg Oral Given 11/05/21 0112)  amLODipine (NORVASC) tablet 10 mg (10 mg Oral Given 11/05/21 0112)  ondansetron (ZOFRAN-ODT) disintegrating tablet 4 mg (4 mg Oral Given 11/05/21 0113)  labetalol (NORMODYNE) injection 10 mg (10 mg Intravenous Given 11/05/21 0115)  lactated ringers bolus 1,000 mL (0 mLs Intravenous Stopped 11/05/21 0421)    ED Course/ Medical Decision Making/ A&P                           Medical Decision Making 31 year old patient who presents with concern for  nausea, headache, chest tightness, decreased appetite, and fatigue for few days.  Hypertensive on intake, vital signs otherwise normal.  Cardiopulmonary exam is normal, abdominal exam is benign.  Patient is neurovascular intact in all extremities and well-appearing.  Differential is broad, includes but is not limited to infectious etiology, hypertensive emergency/ urgency.   Amount and/or Complexity of Data Reviewed Labs: ordered.    Details:  CBC with leukocytosis, however near patient's baseline of 16.  No anemia.  BMP unremarkable, UA without evidence of infection.  Hepatic function panel with mild transaminitis with AST of 68 and total bili elevated to 1.6.  Recommend outpatient follow-up.  Troponin negative, pregnancy test negative.  Risk OTC drugs. Prescription drug management.   Overall patient is well-appearing, no evidence of acute hypertensive emergency at this time.  Pressures improved after single dose of IV labetalol.  Home doses of oral meds administered in the ED.  Recommend close outpatient follow-up and adherence with her medications as prescribed.  No further comport in the ER at this time as patient is well-appearing, asymptomatic, and with hemodynamic stability near patient's baseline hypertension.  Ke voiced understanding of her medical evaluation and treatment plan. Each of their questions answered to their expressed satisfaction.  Return precautions were given.  Patient is well-appearing, stable, and was discharged in good condition.  This chart was dictated using voice recognition software, Dragon. Despite the best efforts of this provider to proofread and correct errors, errors may still occur which can change documentation meaning.  Final Clinical Impression(s) / ED Diagnoses Final diagnoses:  Hypertension, unspecified type    Rx / DC Orders ED Discharge Orders     None         Sherrilee GillesSponseller, Azrielle Springsteen R, PA-C 11/05/21 [id number]    Shon BatonHorton, Courtney F, MD 11/05/21 2256

## 2021-11-05 NOTE — Discharge Instructions (Signed)
You are seen in the ER today for your nausea, headache, and fatigue.  Your blood pressure was very high but your low blood work was reassuring. You may also have a viral illness contributing to your symptoms.  Please continue to take your prescribed blood pressure medications daily and follow-up closely with your primary care doctor.  Return to the ER with any new severe symptoms.

## 2022-02-18 ENCOUNTER — Encounter (HOSPITAL_COMMUNITY): Payer: Self-pay

## 2022-02-18 ENCOUNTER — Other Ambulatory Visit: Payer: Self-pay

## 2022-02-18 ENCOUNTER — Emergency Department (HOSPITAL_COMMUNITY)
Admission: EM | Admit: 2022-02-18 | Discharge: 2022-02-18 | Disposition: A | Discharge status: Home / Self Care | Payer: Medicaid Other | Attending: Emergency Medicine | Admitting: Emergency Medicine

## 2022-02-18 DIAGNOSIS — Z79899 Other long term (current) drug therapy: Secondary | ICD-10-CM | POA: Insufficient documentation

## 2022-02-18 DIAGNOSIS — I160 Hypertensive urgency: Secondary | ICD-10-CM

## 2022-02-18 DIAGNOSIS — I10 Essential (primary) hypertension: Secondary | ICD-10-CM

## 2022-02-18 LAB — CBC WITH DIFFERENTIAL/PLATELET
Abs Immature Granulocytes: 0.06 10*3/uL (ref 0.00–0.07)
Basophils Absolute: 0.1 10*3/uL (ref 0.0–0.1)
Basophils Relative: 0 %
Eosinophils Absolute: 0.1 10*3/uL (ref 0.0–0.5)
Eosinophils Relative: 1 %
HCT: 40.5 % (ref 36.0–46.0)
Hemoglobin: 12.2 g/dL (ref 12.0–15.0)
Immature Granulocytes: 0 %
Lymphocytes Relative: 30 %
Lymphs Abs: 4.8 10*3/uL — ABNORMAL HIGH (ref 0.7–4.0)
MCH: 21.6 pg — ABNORMAL LOW (ref 26.0–34.0)
MCHC: 30.1 g/dL (ref 30.0–36.0)
MCV: 71.6 fL — ABNORMAL LOW (ref 80.0–100.0)
Monocytes Absolute: 0.7 10*3/uL (ref 0.1–1.0)
Monocytes Relative: 5 %
Neutro Abs: 10.3 10*3/uL — ABNORMAL HIGH (ref 1.7–7.7)
Neutrophils Relative %: 64 %
Platelets: 481 10*3/uL — ABNORMAL HIGH (ref 150–400)
RBC: 5.66 MIL/uL — ABNORMAL HIGH (ref 3.87–5.11)
RDW: 19 % — ABNORMAL HIGH (ref 11.5–15.5)
WBC: 16.1 10*3/uL — ABNORMAL HIGH (ref 4.0–10.5)
nRBC: 0 % (ref 0.0–0.2)

## 2022-02-18 LAB — BASIC METABOLIC PANEL
Anion gap: 8 (ref 5–15)
BUN: 10 mg/dL (ref 6–20)
CO2: 26 mmol/L (ref 22–32)
Calcium: 8.8 mg/dL — ABNORMAL LOW (ref 8.9–10.3)
Chloride: 103 mmol/L (ref 98–111)
Creatinine, Ser: 0.86 mg/dL (ref 0.44–1.00)
GFR, Estimated: >60 mL/min (ref >60)
Glucose, Bld: 97 mg/dL (ref 70–99)
Potassium: 3.3 mmol/L — ABNORMAL LOW (ref 3.5–5.1)
Sodium: 137 mmol/L (ref 135–145)

## 2022-02-18 LAB — TROPONIN I (HIGH SENSITIVITY): Troponin I (High Sensitivity): 10 ng/L (ref <18)

## 2022-02-18 MED ORDER — METOPROLOL SUCCINATE ER 25 MG PO TB24: 25.0000 mg | ORAL_TABLET | Freq: Every day | ORAL | 0 refills | Status: DC

## 2022-02-18 MED ORDER — AMLODIPINE BESYLATE 10 MG PO TABS: 10.0000 mg | ORAL_TABLET | Freq: Every day | ORAL | 0 refills | Status: DC

## 2022-02-18 MED ORDER — HYDRALAZINE HCL 20 MG/ML IJ SOLN
10.0000 mg | Freq: Once | INTRAMUSCULAR | Status: AC
  Administered 2022-02-18: 10 mg via INTRAMUSCULAR
  Filled 2022-02-18: qty 1

## 2022-02-18 MED ORDER — AMLODIPINE BESYLATE 5 MG PO TABS
10.0000 mg | ORAL_TABLET | Freq: Once | ORAL | Status: AC
Start: 2022-02-18 — End: 2022-02-18
  Administered 2022-02-18: 10 mg via ORAL
  Filled 2022-02-18: qty 2

## 2022-02-18 NOTE — ED Triage Notes (Signed)
Patient reports that she has been hypertensive and states her BP at home was 181/120. Patient denies any blurred vision, headache, or dizziness.  Patient states she is prescribed Metoprolol and Amlodipine, but ran out of Amlodipine 3 days ago and needs to have it refilled. BP -213/126 in triage.

## 2022-02-18 NOTE — ED Provider Notes (Addendum)
[facility] Provider Note   [name]: [id number] Arrival date & time: 02/18/22  1759     History  Chief Complaint  Patient presents with   Hypertension    Christina Bailey is a 31 y.o. adult.  31 year old nonbinary patient presents today for evaluation of elevated blood pressure for the past 3 days.  They report blood pressure of around 170s/180 systolic at home over 80s diastolic.  Today they noticed systolic of about 200.  They state number scared her so they came in for evaluation.  They deny chest pain, shortness of breath, balance issues, visual change or other complaints.  Patient reports they have been out of medication for the past 3 days.  Still has metoprolol which patient took about 3 hours ago.  Patient states they have refills available for Norvasc dose has not been able to fill this and pick it up.  The history is provided by the patient. No language interpreter was used.       Home Medications Prior to Admission medications   Medication Sig Start Date End Date Taking? Authorizing Provider  amLODipine (NORVASC) 10 MG tablet Take 1 tablet (10 mg total) by mouth daily. 06/14/21 11/05/21  Orion Crook I, NP  levothyroxine (SYNTHROID) 25 MCG tablet TAKE 1 TABLET(25 MCG) BY MOUTH DAILY BEFORE BREAKFAST 08/08/21   Reva Bores, MD  metFORMIN (GLUCOPHAGE) 500 MG tablet TAKE 1 TABLET BY MOUTH TWICE DAILY 08/08/21   Reva Bores, MD  metoprolol succinate (TOPROL-XL) 25 MG 24 hr tablet Take 1 tablet (25 mg total) by mouth daily. 06/13/21 11/05/21  Passmore, Enid Derry I, NP  potassium chloride SA (KLOR-CON M) 20 MEQ tablet Take 1 tablet (20 mEq total) by mouth daily. Patient not taking: Reported on 11/05/2021 07/26/21   Orion Crook I, NP      Allergies    Patient has no known allergies.    Review of Systems   Review of Systems  Constitutional:  Negative for chills and fever.  Eyes:  Negative for visual disturbance.  Respiratory:  Negative for  shortness of breath.   Cardiovascular:  Negative for chest pain.  Neurological:  Negative for syncope and weakness.  All other systems reviewed and are negative.   Physical Exam Updated Vital Signs BP (!) 217/146 (BP Location: Left Arm)   Pulse 85   Temp 98.3 F (36.8 C) (Oral)   Resp 18   Ht 5\' 5"  (1.651 m)   Wt (!) 142.9 kg   LMP 02/18/2022   SpO2 100%   BMI 52.42 kg/m  Physical Exam Vitals and nursing note reviewed.  Constitutional:      General: Christina Smarr "Ke" is not in acute distress.    Appearance: Normal appearance. Christina Goulart "Ke" is obese. Christina Shea "Ke" is not ill-appearing.  HENT:     Head: Normocephalic and atraumatic.     Nose: Nose normal.  Eyes:     General: No scleral icterus.    Extraocular Movements: Extraocular movements intact.     Conjunctiva/sclera: Conjunctivae normal.  Cardiovascular:     Rate and Rhythm: Normal rate and regular rhythm.     Pulses: Normal pulses.  Pulmonary:     Effort: Pulmonary effort is normal. No respiratory distress.     Breath sounds: Normal breath sounds. No wheezing or rales.  Abdominal:     General: There is no distension.     Tenderness: There is no abdominal tenderness.  Musculoskeletal:  General: Normal range of motion.     Cervical back: Normal range of motion.  Skin:    General: Skin is warm and dry.  Neurological:     General: No focal deficit present.     Mental Status: Christina Tinnell "Ke" is alert and oriented to person, place, and time. Mental status is at baseline.     Comments: Cranial nerves III through XII intact.  Full range of motion in bilateral upper and lower extremities with 5/5 strength.  Sensation intact and symmetrical.     ED Results / Procedures / Treatments   Labs (all labs ordered are listed, but only abnormal results are displayed) Labs Reviewed  CBC WITH DIFFERENTIAL/PLATELET - Abnormal; Notable for the following components:      Result Value   WBC 16.1  (*)    RBC 5.66 (*)    MCV 71.6 (*)    MCH 21.6 (*)    RDW 19.0 (*)    Platelets 481 (*)    Neutro Abs 10.3 (*)    Lymphs Abs 4.8 (*)    All other components within normal limits  BASIC METABOLIC PANEL  TROPONIN I (HIGH SENSITIVITY)    EKG None  Radiology No results found.  Procedures Procedures    Medications Ordered in ED Medications  hydrALAZINE (APRESOLINE) injection 10 mg (has no administration in time range)  amLODipine (NORVASC) tablet 10 mg (has no administration in time range)    ED Course/ Medical Decision Making/ A&P                           Medical Decision Making Risk Prescription drug management.   Medical Decision Making / ED Course   This patient presents to the ED for concern of elevated blood pressure, this involves an extensive number of treatment options, and is a complaint that carries with it a high risk of complications and morbidity.  The differential diagnosis includes hypertensive urgency, hypertensive emergency  MDM: Patient presents with elevated blood pressure of 217/146.  At home has been ranging from 180/200 systolic.  Without symptoms concerning for endorgan damage.  Has been out of Norvasc for the past 3 days.  Last took metoprolol XL 3 hours ago.  Will provide dose of hydralazine, and Norvasc.  Will reevaluate.  Blood work was drawn in triage including CBC, BMP, troponin.  Will await these Results.  EKG without acute ischemic changes. Troponin of 10.  EKG without acute ischemic changes.  Doubt ACS.  BMP with mild hypokalemia.  CBC with chronic leukocytosis of 16.  Without signs or symptoms concerning for an acute infection. without anemia. Patient's blood pressure following hydralazine and Norvasc is 190/118.  Patient is appropriate for discharge.  Discharged in stable condition.  Strict return precautions discussed.  Refills for metoprolol, Norvasc provided at patient's request.  She will follow-up with her PCP.  Discussed blood  pressure diary.  Lab Tests: -I ordered, reviewed, and interpreted labs.   The pertinent results include:   Labs Reviewed  CBC WITH DIFFERENTIAL/PLATELET - Abnormal; Notable for the following components:      Result Value   WBC 16.1 (*)    RBC 5.66 (*)    MCV 71.6 (*)    MCH 21.6 (*)    RDW 19.0 (*)    Platelets 481 (*)    Neutro Abs 10.3 (*)    Lymphs Abs 4.8 (*)    All other components within normal limits  BASIC METABOLIC PANEL  TROPONIN I (HIGH SENSITIVITY)      EKG  EKG Interpretation  Date/Time:    Ventricular Rate:    PR Interval:    QRS Duration:   QT Interval:    QTC Calculation:   R Axis:     Text Interpretation:          Medicines ordered and prescription drug management: Meds ordered this encounter  Medications   hydrALAZINE (APRESOLINE) injection 10 mg   amLODipine (NORVASC) tablet 10 mg    -I have reviewed the patients home medicines and have made adjustments as needed  Cardiac Monitoring: The patient was maintained on a cardiac monitor.  I personally viewed and interpreted the cardiac monitored which showed an underlying rhythm of: Normal sinus rhythm  Reevaluation: After the interventions noted above, I reevaluated the patient and found that they have :stayed the same Remained asymptomatic throughout the emergency room stay.  BP improved prior to discharge.  Co morbidities that complicate the patient evaluation  Past Medical History:  Diagnosis Date   Anxiety    GERD (gastroesophageal reflux disease)    Hypertension    Hypocalcemia 05/2020   Hypokalemia 05/2020   PCOS (polycystic ovarian syndrome)    Sickle cell trait (HCC)    Vitamin D deficiency 05/2020      Dispostion: Patient is appropriate for discharge.  Discharged in stable condition.  Return precautions discussed.  Final Clinical Impression(s) / ED Diagnoses Final diagnoses:  Hypertensive urgency    Rx / DC Orders ED Discharge Orders          Ordered    amLODipine  (NORVASC) 10 MG tablet  Daily        02/18/22 2115    metoprolol succinate (TOPROL-XL) 25 MG 24 hr tablet  Daily        02/18/22 2115              Evlyn Courier, PA-C 02/18/22 2117    Evlyn Courier, PA-C 02/18/22 2119    Godfrey Pick, MD 02/18/22 2356

## 2022-02-18 NOTE — Discharge Instructions (Addendum)
Your work-up today was reassuring.  You are without any concerning symptoms such as visual change, shortness of breath, chest pain, balance issues.  You received a dose of hydralazine, and your home dose of Norvasc.  Have also sent additional refills for your home medications.  If you notice any of the concerning symptoms that I mentioned before please return to the emergency room for evaluation immediately.  Otherwise follow-up with your primary care provider.  Also keep a blood pressure diary to better allow your primary care provider to adjust your blood pressure medication.  Continue to make lifestyle changes.  I would recommend cutting back on sodium.

## 2022-03-03 ENCOUNTER — Ambulatory Visit: Payer: Self-pay | Admitting: Nurse Practitioner

## 2022-03-10 ENCOUNTER — Other Ambulatory Visit: Payer: Self-pay | Admitting: Nurse Practitioner

## 2022-03-10 ENCOUNTER — Encounter: Payer: Self-pay | Admitting: Nurse Practitioner

## 2022-03-10 ENCOUNTER — Ambulatory Visit (INDEPENDENT_AMBULATORY_CARE_PROVIDER_SITE_OTHER): Payer: Self-pay | Admitting: Nurse Practitioner

## 2022-03-10 VITALS — BP 136/85 | HR 95 | Temp 99.0°F | Ht 66.0 in | Wt 312.2 lb

## 2022-03-10 DIAGNOSIS — I10 Essential (primary) hypertension: Secondary | ICD-10-CM

## 2022-03-10 DIAGNOSIS — Z0000 Encounter for general adult medical examination without abnormal findings: Secondary | ICD-10-CM

## 2022-03-10 DIAGNOSIS — E876 Hypokalemia: Secondary | ICD-10-CM

## 2022-03-10 DIAGNOSIS — R718 Other abnormality of red blood cells: Secondary | ICD-10-CM

## 2022-03-10 DIAGNOSIS — Z6841 Body Mass Index (BMI) 40.0 and over, adult: Secondary | ICD-10-CM

## 2022-03-10 DIAGNOSIS — E039 Hypothyroidism, unspecified: Secondary | ICD-10-CM

## 2022-03-10 MED ORDER — AMLODIPINE BESYLATE 10 MG PO TABS: 10.0000 mg | ORAL_TABLET | Freq: Every day | ORAL | 0 refills | Status: DC

## 2022-03-10 MED ORDER — METOPROLOL SUCCINATE ER 25 MG PO TB24: 25.0000 mg | ORAL_TABLET | Freq: Every day | ORAL | 0 refills | Status: DC

## 2022-03-10 NOTE — Assessment & Plan Note (Signed)
Wt Readings from Last 3 Encounters:  03/10/22 (!) 312 lb 4 oz (141.6 kg)  02/18/22 (!) 315 lb (142.9 kg)  11/04/21 (!) 315 lb (142.9 kg)  Patient reports that she stays active at work she has been eating more plant-based diet. Need to increase intake of whole food consisting mainly vegetables and protein less carbohydrate drinking at least 64 ounces of water daily, importance of portion control, importance of engaging in regular moderate to vigorous exercises at least 150 minutes discussed.  Benefits of healthy weight also discussed with patient Patient referred to the medical weight management clinic.
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
## 2022-03-10 NOTE — Assessment & Plan Note (Signed)
BP Readings from Last 3 Encounters:  03/10/22 136/85  02/18/22 (!) 190/119  11/05/21 (!) 176/101  Chronic medical condition well-controlled on amlodipine 10 mg daily, metoprolol 25 mg daily.  Continue current medications.  Need to take medications consistently daily discussed with patient DASH diet advised need to engage in regular moderate to vigorous exercises at least 150 minutes weekly discussed Lab Results  Component Value Date   NA 137 02/18/2022   K 3.3 (L) 02/18/2022   CO2 26 02/18/2022   GLUCOSE 97 02/18/2022   BUN 10 02/18/2022   CREATININE 0.86 02/18/2022   CALCIUM 8.8 (L) 02/18/2022   EGFR 88 04/25/2021   GFRNONAA >60 02/18/2022

## 2022-03-10 NOTE — Assessment & Plan Note (Signed)
Lab Results  Component Value Date   TSH 4.820 (H) 07/23/2020  She has not been compliant with levothyroxine made last taking about 3 weeks ago Patient encouraged to start taking levothyroxine 25 mg daily. Check TSH T4 levels today We will recheck labs in 6 weeks.

## 2022-03-10 NOTE — Assessment & Plan Note (Addendum)
Annual exam as documented.  Counseling done include healthy lifestyle involving committing to 150 minutes of exercise per week, heart healthy diet, and attaining healthy weight. The importance of adequate sleep also discussed.  Regular use of seat belt and home safety were also discussed . Changes in health habits are decided on by patient with goals and time frames set for achieving them. Immunization and screening  needs are specifically addressed at this visit.  Up-to-date with Pap exam and Tdap vaccine.  Patient declines flu vaccine today

## 2022-03-10 NOTE — Assessment & Plan Note (Addendum)
Lab Results  Component Value Date   NA 137 02/18/2022   K 3.3 (L) 02/18/2022   CO2 26 02/18/2022   GLUCOSE 97 02/18/2022   BUN 10 02/18/2022   CREATININE 0.86 02/18/2022   CALCIUM 8.8 (L) 02/18/2022   EGFR 88 04/25/2021   GFRNONAA >60 02/18/2022  Denies nausea, vomiting, diarrhea, chest pain Will recheck potassium level today

## 2022-03-10 NOTE — Assessment & Plan Note (Signed)
Lab Results  Component Value Date   WBC 16.1 (H) 02/18/2022   HGB 12.2 02/18/2022   HCT 40.5 02/18/2022   MCV 71.6 (L) 02/18/2022   PLT 481 (H) 02/18/2022  Check iron panel.

## 2022-03-10 NOTE — Patient Instructions (Addendum)
  Please continue current medications as dicussed    It is important that you exercise regularly at least 30 minutes 5 times a week as tolerated  Think about what you will eat, plan ahead. Choose " clean, green, fresh or frozen" over canned, processed or packaged foods which are more sugary, salty and fatty. 70 to 75% of food eaten should be vegetables and fruit. Three meals at set times with snacks allowed between meals, but they must be fruit or vegetables. Aim to eat over a 12 hour period , example 7 am to 7 pm, and STOP after  your last meal of the day. Drink water,generally about 64 ounces per day, no other drink is as healthy. Fruit juice is best enjoyed in a healthy way, by EATING the fruit.  Thanks for choosing [facility] we consider it a privelige to serve you.

## 2022-03-11 LAB — POTASSIUM: Potassium: 3.9 mmol/L (ref 3.5–5.2)

## 2022-03-11 LAB — TSH+FREE T4
Free T4: 1.31 ng/dL (ref 0.82–1.77)
TSH: 4.35 u[IU]/mL (ref 0.450–4.500)

## 2022-03-11 LAB — LIPID PANEL
Chol/HDL Ratio: 4.4 ratio (ref 0.0–4.4)
Cholesterol, Total: 199 mg/dL (ref 100–199)
HDL: 45 mg/dL (ref >39)
LDL Chol Calc (NIH): 137 mg/dL — ABNORMAL HIGH (ref 0–99)
Triglycerides: 94 mg/dL (ref 0–149)
VLDL Cholesterol Cal: 17 mg/dL (ref 5–40)

## 2022-03-11 LAB — IRON,TIBC AND FERRITIN PANEL
Ferritin: 54 ng/mL (ref 15–150)
Iron Saturation: 6 % — CL (ref 15–55)
Iron: 19 ug/dL — ABNORMAL LOW (ref 27–159)
Total Iron Binding Capacity: 335 ug/dL (ref 250–450)
UIBC: 316 ug/dL (ref 131–425)

## 2022-03-11 LAB — VITAMIN D 25 HYDROXY (VIT D DEFICIENCY, FRACTURES): Vit D, 25-Hydroxy: 22.1 ng/mL — ABNORMAL LOW (ref 30.0–100.0)

## 2022-03-13 NOTE — Progress Notes (Signed)
Iron deficiency anemia. Start taking ferous sulphate 325mg  daily , she can buy this med over the counter   Vitamin d deficiency. Start taking vitamin D 1000 units daily, she can get this med OTC as well.   LDL is elevated . Eat a healthy diet, including lots of fruits and vegetables. Avoid foods with a lot of saturated and trans fats, such as red meat, butter, fried foods and cheese . Maintain a healthy weight.   Other labs are normal

## 2022-04-07 ENCOUNTER — Ambulatory Visit (INDEPENDENT_AMBULATORY_CARE_PROVIDER_SITE_OTHER): Payer: Self-pay | Admitting: Nurse Practitioner

## 2022-04-07 ENCOUNTER — Encounter: Payer: Self-pay | Admitting: Nurse Practitioner

## 2022-04-07 VITALS — BP 138/84 | HR 97 | Temp 98.8°F | Ht 65.0 in | Wt 317.2 lb

## 2022-04-07 DIAGNOSIS — E559 Vitamin D deficiency, unspecified: Secondary | ICD-10-CM

## 2022-04-07 DIAGNOSIS — D508 Other iron deficiency anemias: Secondary | ICD-10-CM

## 2022-04-07 DIAGNOSIS — D509 Iron deficiency anemia, unspecified: Secondary | ICD-10-CM | POA: Insufficient documentation

## 2022-04-07 DIAGNOSIS — J0181 Other acute recurrent sinusitis: Secondary | ICD-10-CM

## 2022-04-07 DIAGNOSIS — I10 Essential (primary) hypertension: Secondary | ICD-10-CM

## 2022-04-07 MED ORDER — AMOXICILLIN-POT CLAVULANATE 875-125 MG PO TABS: 1.0000 | ORAL_TABLET | Freq: Two times a day (BID) | ORAL | 0 refills | Status: DC

## 2022-04-07 MED ORDER — SALINE SPRAY 0.65 % NA SOLN: 1.0000 | NASAL | 0 refills | Status: DC | PRN

## 2022-04-07 MED ORDER — FLUTICASONE PROPIONATE 50 MCG/ACT NA SUSP: 2.0000 | Freq: Every day | NASAL | 6 refills | Status: DC

## 2022-04-07 NOTE — Assessment & Plan Note (Signed)
-   amoxicillin-clavulanate (AUGMENTIN) 875-125 MG tablet; Take 1 tablet by mouth 2 (two) times daily.  Dispense: 14 tablet; Refill: 0 - fluticasone (FLONASE) 50 MCG/ACT nasal spray; Place 2 sprays into both nostrils daily.  Dispense: 16 g; Refill: 6 - sodium chloride (OCEAN) 0.65 % SOLN nasal spray; Place 1 spray into both nostrils as needed for congestion.  Dispense: 30 mL; Refill: 0

## 2022-04-07 NOTE — Assessment & Plan Note (Signed)
BP Readings from Last 3 Encounters:  04/07/22 138/84  03/10/22 136/85  02/18/22 (!) 190/119  Chronic medical condition well-controlled on amlodipine 10 mg daily, metoprolol 25 mg daily Continue current medications DASH diet advised engage in regular moderate to vigorous exercises at least 150 minutes weekly

## 2022-04-07 NOTE — Assessment & Plan Note (Signed)
Last vitamin D Lab Results  Component Value Date   VD25OH 22.1 (L) 03/10/2022  Taking vitamin D 1000 units daily.

## 2022-04-07 NOTE — Patient Instructions (Signed)
1. Other acute recurrent sinusitis  - amoxicillin-clavulanate (AUGMENTIN) 875-125 MG tablet; Take 1 tablet by mouth 2 (two) times daily.  Dispense: 14 tablet; Refill: 0 - fluticasone (FLONASE) 50 MCG/ACT nasal spray; Place 2 sprays into both nostrils daily.  Dispense: 16 g; Refill: 6 - sodium chloride (OCEAN) 0.65 % SOLN nasal spray; Place 1 spray into both nostrils as needed for congestion.  Dispense: 30 mL; Refill: 0   It is important that you exercise regularly at least 30 minutes 5 times a week as tolerated  Think about what you will eat, plan ahead. Choose " clean, green, fresh or frozen" over canned, processed or packaged foods which are more sugary, salty and fatty. 70 to 75% of food eaten should be vegetables and fruit. Three meals at set times with snacks allowed between meals, but they must be fruit or vegetables. Aim to eat over a 12 hour period , example 7 am to 7 pm, and STOP after  your last meal of the day. Drink water,generally about 64 ounces per day, no other drink is as healthy. Fruit juice is best enjoyed in a healthy way, by EATING the fruit.  Thanks for choosing [facility] we consider it a privelige to serve you.

## 2022-04-07 NOTE — Assessment & Plan Note (Signed)
Lab Results  Component Value Date   WBC 16.1 (H) 02/18/2022   HGB 12.2 02/18/2022   HCT 40.5 02/18/2022   MCV 71.6 (L) 02/18/2022   PLT 481 (H) 02/18/2022   Lab Results  Component Value Date   IRON 19 (L) 03/10/2022   TIBC 335 03/10/2022   FERRITIN 54 03/10/2022  Start ferrous sulfate 325 mg daily, patient told to buy the medication medication.
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
## 2022-04-10 ENCOUNTER — Ambulatory Visit: Payer: Medicaid Other | Admitting: Dietician

## 2022-04-21 ENCOUNTER — Ambulatory Visit: Payer: Self-pay | Admitting: Nurse Practitioner

## 2022-08-07 ENCOUNTER — Other Ambulatory Visit: Payer: Self-pay

## 2022-08-07 DIAGNOSIS — I10 Essential (primary) hypertension: Secondary | ICD-10-CM

## 2022-08-07 MED ORDER — AMLODIPINE BESYLATE 10 MG PO TABS: 10.0000 mg | ORAL_TABLET | Freq: Every day | ORAL | 0 refills | Status: DC

## 2022-08-07 MED ORDER — METOPROLOL SUCCINATE ER 25 MG PO TB24: 25.0000 mg | ORAL_TABLET | Freq: Every day | ORAL | 0 refills | Status: DC

## 2022-08-07 NOTE — Telephone Encounter (Signed)
Christina Bailey

## 2022-08-07 NOTE — Telephone Encounter (Signed)
From: Shanece Awan To: Office of Renee Rival, [place] Sent: 08/07/2022 11:30 AM EST Subject: Medication Renewal Request  Refills have been requested for the following medications:   metoprolol succinate (TOPROL-XL) 25 MG 24 hr tablet [Folashade R Paseda]   amLODipine (NORVASC) 10 MG tablet [Folashade R Paseda]  Preferred pharmacy: [id number] - [place], [place] - [address] Delivery method: Pickup

## 2022-12-01 ENCOUNTER — Other Ambulatory Visit: Payer: Self-pay | Admitting: Nurse Practitioner

## 2022-12-01 DIAGNOSIS — I10 Essential (primary) hypertension: Secondary | ICD-10-CM

## 2022-12-04 ENCOUNTER — Other Ambulatory Visit: Payer: Self-pay

## 2022-12-04 DIAGNOSIS — I10 Essential (primary) hypertension: Secondary | ICD-10-CM

## 2022-12-04 MED ORDER — AMLODIPINE BESYLATE 10 MG PO TABS: 10.0000 mg | ORAL_TABLET | Freq: Every day | ORAL | 0 refills | Status: DC

## 2022-12-04 MED ORDER — METOPROLOL SUCCINATE ER 25 MG PO TB24: 25.0000 mg | ORAL_TABLET | Freq: Every day | ORAL | 0 refills | Status: DC

## 2022-12-04 NOTE — Telephone Encounter (Signed)
From: Kennette Arredondo To: Office of Donell Beers, [place] Sent: 12/03/2022 7:59 PM EDT Subject: Medication Renewal Request  Refills have been requested for the following medications:   metoprolol succinate (TOPROL-XL) 25 MG 24 hr tablet [Folashade R Paseda]   amLODipine (NORVASC) 10 MG tablet [Folashade R Paseda]  Preferred pharmacy: [id number] - [place], [place] - [address] AT [facility] [address] Delivery method: Pickup

## 2023-01-08 ENCOUNTER — Other Ambulatory Visit: Payer: Self-pay

## 2023-01-08 ENCOUNTER — Encounter (HOSPITAL_COMMUNITY): Payer: Self-pay

## 2023-01-08 ENCOUNTER — Emergency Department (HOSPITAL_COMMUNITY)
Admission: EM | Admit: 2023-01-08 | Discharge: 2023-01-09 | Disposition: A | Discharge status: Home / Self Care | Payer: Managed Care, Other (non HMO) | Attending: Emergency Medicine | Admitting: Emergency Medicine

## 2023-01-08 DIAGNOSIS — Z7984 Long term (current) use of oral hypoglycemic drugs: Secondary | ICD-10-CM | POA: Diagnosis not present

## 2023-01-08 DIAGNOSIS — R600 Localized edema: Secondary | ICD-10-CM | POA: Diagnosis not present

## 2023-01-08 DIAGNOSIS — E876 Hypokalemia: Secondary | ICD-10-CM | POA: Diagnosis not present

## 2023-01-08 DIAGNOSIS — R000 Tachycardia, unspecified: Secondary | ICD-10-CM | POA: Diagnosis not present

## 2023-01-08 DIAGNOSIS — Z79899 Other long term (current) drug therapy: Secondary | ICD-10-CM | POA: Diagnosis not present

## 2023-01-08 DIAGNOSIS — D72829 Elevated white blood cell count, unspecified: Secondary | ICD-10-CM | POA: Diagnosis not present

## 2023-01-08 DIAGNOSIS — M7989 Other specified soft tissue disorders: Secondary | ICD-10-CM | POA: Diagnosis present

## 2023-01-08 NOTE — ED Triage Notes (Signed)
Pt presents via POV c/o hypertension and lower extremity swelling. Reports has hx of HTN. Denies pain.

## 2023-01-08 NOTE — ED Notes (Signed)
EKG preformed, given to Satira Mccallum.D.

## 2023-01-09 ENCOUNTER — Telehealth: Payer: Self-pay

## 2023-01-09 ENCOUNTER — Emergency Department (HOSPITAL_COMMUNITY): Payer: Managed Care, Other (non HMO)

## 2023-01-09 DIAGNOSIS — R600 Localized edema: Secondary | ICD-10-CM | POA: Diagnosis not present

## 2023-01-09 LAB — COMPREHENSIVE METABOLIC PANEL
ALT: 28 U/L (ref 0–44)
AST: 24 U/L (ref 15–41)
Albumin: 3.4 g/dL — ABNORMAL LOW (ref 3.5–5.0)
Alkaline Phosphatase: 96 U/L (ref 38–126)
Anion gap: 11 (ref 5–15)
BUN: 11 mg/dL (ref 6–20)
CO2: 25 mmol/L (ref 22–32)
Calcium: 8.4 mg/dL — ABNORMAL LOW (ref 8.9–10.3)
Chloride: 102 mmol/L (ref 98–111)
Creatinine, Ser: 0.92 mg/dL (ref 0.44–1.00)
GFR, Estimated: >60 mL/min (ref >60)
Glucose, Bld: 122 mg/dL — ABNORMAL HIGH (ref 70–99)
Potassium: 3.2 mmol/L — ABNORMAL LOW (ref 3.5–5.1)
Sodium: 138 mmol/L (ref 135–145)
Total Bilirubin: 0.4 mg/dL (ref 0.3–1.2)
Total Protein: 7.7 g/dL (ref 6.5–8.1)

## 2023-01-09 LAB — CBC WITH DIFFERENTIAL/PLATELET
Abs Immature Granulocytes: 0.11 10*3/uL — ABNORMAL HIGH (ref 0.00–0.07)
Basophils Absolute: 0.1 10*3/uL (ref 0.0–0.1)
Basophils Relative: 0 %
Eosinophils Absolute: 0.2 10*3/uL (ref 0.0–0.5)
Eosinophils Relative: 1 %
HCT: 39.6 % (ref 36.0–46.0)
Hemoglobin: 11.8 g/dL — ABNORMAL LOW (ref 12.0–15.0)
Immature Granulocytes: 1 %
Lymphocytes Relative: 33 %
Lymphs Abs: 5.5 10*3/uL — ABNORMAL HIGH (ref 0.7–4.0)
MCH: 21.3 pg — ABNORMAL LOW (ref 26.0–34.0)
MCHC: 29.8 g/dL — ABNORMAL LOW (ref 30.0–36.0)
MCV: 71.6 fL — ABNORMAL LOW (ref 80.0–100.0)
Monocytes Absolute: 0.7 10*3/uL (ref 0.1–1.0)
Monocytes Relative: 4 %
Neutro Abs: 10.1 10*3/uL — ABNORMAL HIGH (ref 1.7–7.7)
Neutrophils Relative %: 61 %
Platelets: 498 10*3/uL — ABNORMAL HIGH (ref 150–400)
RBC: 5.53 MIL/uL — ABNORMAL HIGH (ref 3.87–5.11)
RDW: 19.7 % — ABNORMAL HIGH (ref 11.5–15.5)
WBC: 16.6 10*3/uL — ABNORMAL HIGH (ref 4.0–10.5)
nRBC: 0 % (ref 0.0–0.2)

## 2023-01-09 LAB — TROPONIN I (HIGH SENSITIVITY): Troponin I (High Sensitivity): 4 ng/L (ref <18)

## 2023-01-09 LAB — BRAIN NATRIURETIC PEPTIDE: B Natriuretic Peptide: 51.1 pg/mL (ref 0.0–100.0)

## 2023-01-09 LAB — HCG, SERUM, QUALITATIVE: Preg, Serum: NEGATIVE

## 2023-01-09 MED ORDER — POTASSIUM CHLORIDE CRYS ER 20 MEQ PO TBCR
40.0000 meq | EXTENDED_RELEASE_TABLET | Freq: Once | ORAL | Status: AC
  Administered 2023-01-09: 40 meq via ORAL
  Filled 2023-01-09: qty 2

## 2023-01-09 NOTE — ED Notes (Signed)
Discharge papers reviewed with pt, pt ambulatory from ED 

## 2023-01-09 NOTE — ED Provider Notes (Signed)
[place] EMERGENCY DEPARTMENT AT [facility] Provider Note   [name]: [id number] Arrival date & time: 01/08/23  2212     History  Chief Complaint  Patient presents with   Hypertension   Leg Swelling    Christina Bailey is a 32 y.o. adult.  The history is provided by the patient and medical records.  Hypertension  Christina Bailey is a 32 y.o. adult who presents to the Emergency Department complaining of leg swelling.  She resents emergency department for evaluation of bilateral leg swelling that started on Saturday.  She did have headache on Saturday, this is now resolved.  She earlier today had some chest tightness, this is now resolved.  She does have a history of hypertension and is compliant with her medications.  She did not take them this evening due to presenting to the emergency department.  No associated shortness of breath, abdominal pain, nausea, vomiting.  She does not take any hormones.  No history of DVT/PE.     Home Medications Prior to Admission medications   Medication Sig Start Date End Date Taking? Authorizing Provider  amLODipine (NORVASC) 10 MG tablet Take 1 tablet (10 mg total) by mouth daily. 12/04/22   Donell Beers, FNP  amoxicillin-clavulanate (AUGMENTIN) 875-125 MG tablet Take 1 tablet by mouth 2 (two) times daily. 04/07/22   Paseda, Baird Kay, FNP  fluticasone (FLONASE) 50 MCG/ACT nasal spray Place 2 sprays into both nostrils daily. 04/07/22   Donell Beers, FNP  levothyroxine (SYNTHROID) 25 MCG tablet TAKE 1 TABLET(25 MCG) BY MOUTH DAILY BEFORE BREAKFAST 08/08/21   Reva Bores, MD  metFORMIN (GLUCOPHAGE) 500 MG tablet TAKE 1 TABLET BY MOUTH TWICE DAILY Patient not taking: Reported on 03/10/2022 08/08/21   Reva Bores, MD  metoprolol succinate (TOPROL-XL) 25 MG 24 hr tablet Take 1 tablet (25 mg total) by mouth daily. 12/04/22 03/04/23  Donell Beers, FNP  sodium chloride (OCEAN) 0.65 % SOLN nasal spray Place 1 spray into both  nostrils as needed for congestion. 04/07/22   Donell Beers, FNP      Allergies    Patient has no known allergies.    Review of Systems   Review of Systems  All other systems reviewed and are negative.   Physical Exam Updated Vital Signs BP 127/77 (BP Location: Right Arm)   Pulse 85   Temp 98.2 F (36.8 C) (Oral)   Resp 18   Ht 5\' 5"  (1.651 m)   Wt (!) 142.9 kg   LMP 12/18/2022   SpO2 97%   BMI 52.42 kg/m  Physical Exam Vitals and nursing note reviewed.  Constitutional:      Appearance: Christina Bailey is well-developed.  HENT:     Head: Normocephalic and atraumatic.  Cardiovascular:     Rate and Rhythm: Normal rate and regular rhythm.     Heart sounds: No murmur heard. Pulmonary:     Effort: Pulmonary effort is normal. No respiratory distress.     Breath sounds: Normal breath sounds.  Abdominal:     Palpations: Abdomen is soft.     Tenderness: There is no abdominal tenderness. There is no guarding or rebound.  Musculoskeletal:        General: No tenderness.     Comments: Trace pitting edema to bilateral lower extremities  Skin:    General: Skin is warm and dry.  Neurological:     Mental Status: Christina Bailey is alert and oriented to person, place, and time.  Psychiatric:  Behavior: Behavior normal.     ED Results / Procedures / Treatments   Labs (all labs ordered are listed, but only abnormal results are displayed) Labs Reviewed  COMPREHENSIVE METABOLIC PANEL - Abnormal; Notable for the following components:      Result Value   Potassium 3.2 (*)    Glucose, Bld 122 (*)    Calcium 8.4 (*)    Albumin 3.4 (*)    All other components within normal limits  CBC WITH DIFFERENTIAL/PLATELET - Abnormal; Notable for the following components:   WBC 16.6 (*)    RBC 5.53 (*)    Hemoglobin 11.8 (*)    MCV 71.6 (*)    MCH 21.3 (*)    MCHC 29.8 (*)    RDW 19.7 (*)    Platelets 498 (*)    Neutro Abs 10.1 (*)    Lymphs Abs 5.5 (*)    Abs Immature Granulocytes 0.11 (*)     All other components within normal limits  BRAIN NATRIURETIC PEPTIDE  HCG, SERUM, QUALITATIVE  TROPONIN I (HIGH SENSITIVITY)    EKG EKG Interpretation Date/Time:  Monday January 08 2023 22:24:30 EDT Ventricular Rate:  110 PR Interval:  123 QRS Duration:  82 QT Interval:  349 QTC Calculation: 473 R Axis:   22  Text Interpretation: Sinus tachycardia Confirmed by Tilden Fossa [phone]) on 01/08/2023 11:03:05 PM  Radiology DG Chest 2 View  Result Date: 01/09/2023 CLINICAL DATA:  Chest pain EXAM: CHEST - 2 VIEW COMPARISON:  11/04/2021 FINDINGS: The heart size and mediastinal contours are within normal limits. Both lungs are clear. The visualized skeletal structures are unremarkable. IMPRESSION: No active cardiopulmonary disease. Electronically Signed   By: Charlett Nose M.D.   On: 01/09/2023 01:10    Procedures Procedures    Medications Ordered in ED Medications  potassium chloride SA (KLOR-CON M) CR tablet 40 mEq (40 mEq Oral Given 01/09/23 0249)    ED Course/ Medical Decision Making/ A&P                                 Medical Decision Making Amount and/or Complexity of Data Reviewed Labs: ordered. Radiology: ordered.  Risk Prescription drug management.   Patient with history of hypertension here for evaluation of bilateral lower extremity edema.  She does have edema on examination without any evidence of cellulitis.  Patient was hypertensive at time of ED arrival, blood pressure improved without intervention.  Labs significant for mild hypokalemia.  She does have leukocytosis and thrombocytosis-this is similar when compared to her baseline.  Current clinical picture is not consistent with PE, cellulitis, decompensated CHF.  Discussed home care for lower extremity edema with outpatient follow-up and return precautions.        Final Clinical Impression(s) / ED Diagnoses Final diagnoses:  Hypokalemia  Bilateral lower extremity edema    Rx / DC Orders ED Discharge  Orders     None         Tilden Fossa, MD 01/09/23 [phone]

## 2023-01-09 NOTE — Transitions of Care (Post Inpatient/ED Visit) (Signed)
   01/09/2023  Name: Christina Bailey MRN: [id number] DOB: 10-09-1990  Today's TOC FU Call Status: Today's TOC FU Call Status:: Unsuccessful Call (1st Attempt) Unsuccessful Call (1st Attempt) Date: 01/09/23  Attempted to reach the patient regarding the most recent Inpatient/ED visit.  Follow Up Plan: Additional outreach attempts will be made to reach the patient to complete the Transitions of Care (Post Inpatient/ED visit) call.   Signature Renelda Loma RMA

## 2023-01-15 ENCOUNTER — Telehealth: Payer: Self-pay

## 2023-01-15 NOTE — Transitions of Care (Post Inpatient/ED Visit) (Signed)
   01/15/2023  Name: Christina Bailey MRN: [id number] DOB: [birth year]  Today's TOC FU Call Status: Today's TOC FU Call Status:: Unsuccessful Call (1st Attempt) Unsuccessful Call (1st Attempt) Date: 01/15/23  Attempted to reach the patient regarding the most recent Inpatient/ED visit.  Follow Up Plan: Additional outreach attempts will be made to reach the patient to complete the Transitions of Care (Post Inpatient/ED visit) call.   Signature Renelda Loma RMA

## 2023-01-17 ENCOUNTER — Telehealth: Payer: Self-pay | Admitting: Nurse Practitioner

## 2023-01-17 NOTE — Telephone Encounter (Signed)
Pt LVM on 01/16/23 @ 11:30am returning Kimberly's call. Please call back

## 2023-01-18 ENCOUNTER — Telehealth: Payer: Self-pay

## 2023-01-18 ENCOUNTER — Telehealth: Payer: Self-pay | Admitting: Nurse Practitioner

## 2023-01-18 NOTE — Transitions of Care (Post Inpatient/ED Visit) (Cosign Needed)
   01/18/2023  Name: Christina Bailey MRN: [id number] DOB: 1991/03/17  Today's TOC FU Call Status: Today's TOC FU Call Status:: Unsuccessful Call (3rd Attempt) Unsuccessful Call (3rd Attempt) Date: 01/18/23  Attempted to reach the patient regarding the most recent Inpatient/ED visit.  Follow Up Plan: No further outreach attempts will be made at this time. We have been unable to contact the patient.  Signature Renelda Loma RMA

## 2023-01-18 NOTE — Telephone Encounter (Signed)
Pt returned Kimberly's call from 01/15/23. Please call back.

## 2023-01-30 ENCOUNTER — Ambulatory Visit (INDEPENDENT_AMBULATORY_CARE_PROVIDER_SITE_OTHER): Payer: Managed Care, Other (non HMO) | Admitting: Nurse Practitioner

## 2023-01-30 ENCOUNTER — Other Ambulatory Visit: Payer: Self-pay | Admitting: Nurse Practitioner

## 2023-01-30 ENCOUNTER — Encounter: Payer: Self-pay | Admitting: Nurse Practitioner

## 2023-01-30 VITALS — BP 132/84 | HR 96 | Temp 97.3°F | Wt 324.2 lb

## 2023-01-30 DIAGNOSIS — E1165 Type 2 diabetes mellitus with hyperglycemia: Secondary | ICD-10-CM | POA: Diagnosis not present

## 2023-01-30 DIAGNOSIS — E876 Hypokalemia: Secondary | ICD-10-CM

## 2023-01-30 DIAGNOSIS — R7989 Other specified abnormal findings of blood chemistry: Secondary | ICD-10-CM

## 2023-01-30 DIAGNOSIS — E039 Hypothyroidism, unspecified: Secondary | ICD-10-CM

## 2023-01-30 DIAGNOSIS — D509 Iron deficiency anemia, unspecified: Secondary | ICD-10-CM

## 2023-01-30 DIAGNOSIS — Z09 Encounter for follow-up examination after completed treatment for conditions other than malignant neoplasm: Secondary | ICD-10-CM

## 2023-01-30 DIAGNOSIS — Z6841 Body Mass Index (BMI) 40.0 and over, adult: Secondary | ICD-10-CM

## 2023-01-30 DIAGNOSIS — I10 Essential (primary) hypertension: Secondary | ICD-10-CM | POA: Diagnosis not present

## 2023-01-30 LAB — POCT GLYCOSYLATED HEMOGLOBIN (HGB A1C): Hemoglobin A1C: 6.3 % — AB (ref 4.0–5.6)

## 2023-01-30 MED ORDER — METOPROLOL SUCCINATE ER 25 MG PO TB24
25.0000 mg | ORAL_TABLET | Freq: Every day | ORAL | 1 refills | Status: DC
Start: 2023-01-30 — End: 2023-09-24

## 2023-01-30 MED ORDER — TIRZEPATIDE 2.5 MG/0.5ML ~~LOC~~ SOAJ
2.5000 mg | SUBCUTANEOUS | 0 refills | Status: DC
Start: 2023-01-30 — End: 2023-01-30

## 2023-01-30 MED ORDER — AMLODIPINE BESYLATE 10 MG PO TABS
10.0000 mg | ORAL_TABLET | Freq: Every day | ORAL | 1 refills | Status: DC
Start: 2023-01-30 — End: 2023-01-30

## 2023-01-30 MED ORDER — SEMAGLUTIDE(0.25 OR 0.5MG/DOS) 2 MG/3ML ~~LOC~~ SOPN
0.2500 mg | PEN_INJECTOR | SUBCUTANEOUS | 0 refills | Status: DC
Start: 2023-01-30 — End: 2023-01-31

## 2023-01-30 MED ORDER — VALSARTAN 80 MG PO TABS: 80.0000 mg | ORAL_TABLET | Freq: Every day | ORAL | 1 refills | Status: DC

## 2023-01-30 MED ORDER — AMLODIPINE BESYLATE 5 MG PO TABS: 5.0000 mg | ORAL_TABLET | Freq: Every day | ORAL | 1 refills | Status: DC

## 2023-01-30 MED ORDER — FERROUS SULFATE 325 (65 FE) MG PO TABS
325.0000 mg | ORAL_TABLET | Freq: Every day | ORAL | 1 refills | Status: AC
Start: 2023-01-30 — End: ?

## 2023-01-30 NOTE — Assessment & Plan Note (Signed)
Currently not on meds Recheck labs today

## 2023-01-30 NOTE — Assessment & Plan Note (Addendum)
BP Readings from Last 3 Encounters:  01/30/23 132/84  01/09/23 127/77  04/07/22 138/84   HTN Controlled .  On amlodipine 10 mg daily, metoprolol 25 mg daily. Will reduce amlodipine to 5 mg daily, start valsartan 80 mg daily, continue metoprolol 25 mg daily Discussed DASH diet and dietary sodium restrictions Continue to increase dietary efforts and exercise.  Follow-up in 4 weeks BMP in 2 weeks CMP today

## 2023-01-30 NOTE — Assessment & Plan Note (Signed)
Lab Results  Component Value Date   WBC 16.6 (H) 01/09/2023   HGB 11.8 (L) 01/09/2023   HCT 39.6 01/09/2023   MCV 71.6 (L) 01/09/2023   PLT 498 (H) 01/09/2023  White blood cell counts platelet count has been consistently elevated Denies fever chills malaise, Will refer patient to hematology for further evaluation
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
## 2023-01-30 NOTE — Assessment & Plan Note (Signed)
ER discharge summary labs and imaging studies reviewed No edema currently

## 2023-01-30 NOTE — Assessment & Plan Note (Addendum)
Lab Results  Component Value Date   HGBA1C 6.3 (A) 01/30/2023  Not taking metformin due to GI upset Start Mounjaro 2.5 mg once weekly, encouraged to avoid fatty fried foods, eat smaller portions of meal while taking medication to help prevent nausea Denies personal or family history of medullary thyroid cancer, no history of pancreatitis Patient counseled on low-carb modified diet Checking lipid panel, urine microalbumin labs Follow-up in 4 weeks Will discuss the need for diabetic eye exam at next visit, diabetic foot exam at next visit Starting valsartan 80 mg for kidney protection

## 2023-01-30 NOTE — Assessment & Plan Note (Addendum)
Wt Readings from Last 3 Encounters:  01/30/23 (!) 324 lb 3.2 oz (147.1 kg)  01/08/23 (!) 315 lb (142.9 kg)  04/07/22 (!) 317 lb 3.2 oz (143.9 kg)   Body mass index is 53.95 kg/m. I Patient counseled on low carb modified diet Encouraged to engage in regular moderate to vigorous exercise at least 150 minutes weekly Benefits of healthy weight discussed.  Starting Mounjaro 2.5 mg once weekly for type 2 diabetes this will also assist with weight loss

## 2023-01-30 NOTE — Patient Instructions (Signed)
1. Iron deficiency anemia, unspecified iron deficiency anemia type  - ferrous sulfate 325 (65 FE) MG tablet; Take 1 tablet (325 mg total) by mouth daily.  Dispense: 90 tablet; Refill: 1  2. Abnormal CBC measurement  - Ambulatory referral to Hematology / Oncology  3. Hypothyroidism, unspecified type  - TSH + free T4  4. Primary hypertension  - metoprolol succinate (TOPROL-XL) 25 MG 24 hr tablet; Take 1 tablet (25 mg total) by mouth daily.  Dispense: 90 tablet; Refill: 1 - amLODipine (NORVASC) 5 MG tablet; Take 1 tablet (5 mg total) by mouth daily.  Dispense: 90 tablet; Refill: 1 - valsartan (DIOVAN) 80 MG tablet; Take 1 tablet (80 mg total) by mouth daily.  Dispense: 90 tablet; Refill: 1 - Basic Metabolic Panel - Basic Metabolic Panel; Future  5. Class 3 severe obesity due to excess calories with serious comorbidity and body mass index (BMI) of 50.0 to 59.9 in adult (HCC)  - Amb Ref to Medical Weight Management   7. Controlled type 2 diabetes mellitus with hyperglycemia, without long-term current use of insulin (HCC)  - tirzepatide (MOUNJARO) 2.5 MG/0.5ML Pen; Inject 2.5 mg into the skin once a week.  Dispense: 2 mL; Refill: 0 - valsartan (DIOVAN) 80 MG tablet; Take 1 tablet (80 mg total) by mouth daily.  Dispense: 90 tablet; Refill: 1 - Microalbumin / creatinine urine ratio - Lipid panel      It is important that you exercise regularly at least 30 minutes 5 times a week as tolerated  Think about what you will eat, plan ahead. Choose " clean, green, fresh or frozen" over canned, processed or packaged foods which are more sugary, salty and fatty. 70 to 75% of food eaten should be vegetables and fruit. Three meals at set times with snacks allowed between meals, but they must be fruit or vegetables. Aim to eat over a 12 hour period , example 7 am to 7 pm, and STOP after  your last meal of the day. Drink water,generally about 64 ounces per day, no other drink is as healthy. Fruit  juice is best enjoyed in a healthy way, by EATING the fruit.  Thanks for choosing [facility] we consider it a privelige to serve you.

## 2023-01-30 NOTE — Assessment & Plan Note (Signed)
Lab Results  Component Value Date   NA 138 01/09/2023   K 3.2 (L) 01/09/2023   CO2 25 01/09/2023   GLUCOSE 122 (H) 01/09/2023   BUN 11 01/09/2023   CREATININE 0.92 01/09/2023   CALCIUM 8.4 (L) 01/09/2023   EGFR 88 04/25/2021   GFRNONAA >60 01/09/2023  Rechecking labs today.,  Not on any diuretic Starting valsartan for hypertension

## 2023-01-30 NOTE — Assessment & Plan Note (Signed)
Lab Results  Component Value Date   IRON 19 (L) 03/10/2022   TIBC 335 03/10/2022   FERRITIN 54 03/10/2022  Patient encouraged to start taking ferrous sulfate 325 mg daily

## 2023-01-31 ENCOUNTER — Other Ambulatory Visit (HOSPITAL_COMMUNITY): Payer: Self-pay

## 2023-01-31 ENCOUNTER — Other Ambulatory Visit: Payer: Self-pay | Admitting: Nurse Practitioner

## 2023-01-31 DIAGNOSIS — E785 Hyperlipidemia, unspecified: Secondary | ICD-10-CM

## 2023-01-31 DIAGNOSIS — E1165 Type 2 diabetes mellitus with hyperglycemia: Secondary | ICD-10-CM

## 2023-01-31 LAB — LIPID PANEL
Chol/HDL Ratio: 3.5 ratio (ref 0.0–4.4)
Cholesterol, Total: 184 mg/dL (ref 100–199)
HDL: 53 mg/dL (ref >39)
LDL Chol Calc (NIH): 112 mg/dL — ABNORMAL HIGH (ref 0–99)
Triglycerides: 104 mg/dL (ref 0–149)
VLDL Cholesterol Cal: 19 mg/dL (ref 5–40)

## 2023-01-31 LAB — TSH+FREE T4
Free T4: 1.34 ng/dL (ref 0.82–1.77)
TSH: 5.73 u[IU]/mL — ABNORMAL HIGH (ref 0.450–4.500)

## 2023-01-31 LAB — BASIC METABOLIC PANEL
BUN/Creatinine Ratio: 15 (ref 9–23)
BUN: 14 mg/dL (ref 6–20)
CO2: 23 mmol/L (ref 20–29)
Calcium: 9 mg/dL (ref 8.7–10.2)
Chloride: 101 mmol/L (ref 96–106)
Creatinine, Ser: 0.91 mg/dL (ref 0.57–1.00)
Glucose: 114 mg/dL — ABNORMAL HIGH (ref 70–99)
Potassium: 4.1 mmol/L (ref 3.5–5.2)
Sodium: 138 mmol/L (ref 134–144)
eGFR: 88 mL/min/{1.73_m2} (ref >59)

## 2023-01-31 LAB — MICROALBUMIN / CREATININE URINE RATIO
Creatinine, Urine: 93 mg/dL
Microalb/Creat Ratio: 45 mg/g{creat} — ABNORMAL HIGH (ref 0–29)
Microalbumin, Urine: 42.2 ug/mL

## 2023-01-31 MED ORDER — ATORVASTATIN CALCIUM 20 MG PO TABS: 20.0000 mg | ORAL_TABLET | Freq: Every day | ORAL | 0 refills | Status: DC

## 2023-01-31 MED ORDER — SEMAGLUTIDE(0.25 OR 0.5MG/DOS) 2 MG/3ML ~~LOC~~ SOPN
0.2500 mg | PEN_INJECTOR | SUBCUTANEOUS | 0 refills | Status: DC
Start: 2023-01-31 — End: 2023-02-28
  Filled 2023-01-31: qty 3, 28d supply, fill #0

## 2023-02-02 ENCOUNTER — Other Ambulatory Visit (HOSPITAL_COMMUNITY): Payer: Self-pay

## 2023-02-06 ENCOUNTER — Other Ambulatory Visit (HOSPITAL_COMMUNITY): Payer: Self-pay

## 2023-02-08 ENCOUNTER — Other Ambulatory Visit: Payer: Self-pay | Admitting: *Deleted

## 2023-02-08 DIAGNOSIS — D509 Iron deficiency anemia, unspecified: Secondary | ICD-10-CM

## 2023-02-24 ENCOUNTER — Other Ambulatory Visit: Payer: Self-pay | Admitting: Nurse Practitioner

## 2023-02-24 DIAGNOSIS — E1165 Type 2 diabetes mellitus with hyperglycemia: Secondary | ICD-10-CM

## 2023-03-07 ENCOUNTER — Ambulatory Visit (INDEPENDENT_AMBULATORY_CARE_PROVIDER_SITE_OTHER): Payer: Managed Care, Other (non HMO) | Admitting: Nurse Practitioner

## 2023-03-07 VITALS — BP 137/76 | HR 95 | Temp 97.6°F | Wt 322.8 lb

## 2023-03-07 DIAGNOSIS — B353 Tinea pedis: Secondary | ICD-10-CM | POA: Insufficient documentation

## 2023-03-07 DIAGNOSIS — E66813 Obesity, class 3: Secondary | ICD-10-CM

## 2023-03-07 DIAGNOSIS — D509 Iron deficiency anemia, unspecified: Secondary | ICD-10-CM

## 2023-03-07 DIAGNOSIS — E1165 Type 2 diabetes mellitus with hyperglycemia: Secondary | ICD-10-CM | POA: Diagnosis not present

## 2023-03-07 DIAGNOSIS — I10 Essential (primary) hypertension: Secondary | ICD-10-CM

## 2023-03-07 DIAGNOSIS — E785 Hyperlipidemia, unspecified: Secondary | ICD-10-CM

## 2023-03-07 MED ORDER — BLOOD GLUCOSE TEST VI STRP
1.0000 | ORAL_STRIP | Freq: Three times a day (TID) | 0 refills | Status: AC
Start: 2023-03-07 — End: 2023-04-06

## 2023-03-07 MED ORDER — BLOOD GLUCOSE MONITORING SUPPL DEVI: 1.0000 | Freq: Three times a day (TID) | 0 refills | Status: DC

## 2023-03-07 MED ORDER — LANCET DEVICE MISC
1.0000 | Freq: Three times a day (TID) | 0 refills | Status: AC
Start: 2023-03-07 — End: 2023-04-06

## 2023-03-07 MED ORDER — SEMAGLUTIDE(0.25 OR 0.5MG/DOS) 2 MG/3ML ~~LOC~~ SOPN: 0.5000 mg | PEN_INJECTOR | SUBCUTANEOUS | 3 refills | Status: DC

## 2023-03-07 MED ORDER — CLOTRIMAZOLE 1 % EX OINT
1.0000 | TOPICAL_OINTMENT | Freq: Two times a day (BID) | CUTANEOUS | 0 refills | Status: DC
Start: 2023-03-07 — End: 2023-09-25

## 2023-03-07 MED ORDER — LANCETS MISC. MISC
1.0000 | Freq: Three times a day (TID) | 0 refills | Status: AC
Start: 2023-03-07 — End: 2023-04-06
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
## 2023-03-07 NOTE — Assessment & Plan Note (Signed)
Scaly plaques noted on the right sole Start  - Clotrimazole 1 % OINT; Apply 1 Application topically 2 (two) times daily.  Dispense: 56.7 g; Refill: 0

## 2023-03-07 NOTE — Assessment & Plan Note (Addendum)
Wt Readings from Last 3 Encounters:  03/07/23 (!) 322 lb 12.8 oz (146.4 kg)  01/30/23 (!) 324 lb 3.2 oz (147.1 kg)  01/08/23 (!) 315 lb (142.9 kg)   Body mass index is 53.72 kg/m.  She has lost 2 pounds since starting Ozempic Patient counseled on low-carb modified diet Encouraged to engage in regular moderate to vigorous exercises at least 150 minutes weekly Benefits of healthy weights discussed

## 2023-03-07 NOTE — Assessment & Plan Note (Addendum)
She is doing well on Ozempic 0.25 mg once weekly Increase to Ozempic 0.5 mg once weekly injection CBG goals discussed patient encouraged to report blood sugar readings of less than 70, treatment of hypoglycemia discussed Diabetic foot exam completed, patient referred for diabetic eye exam Follow-up in 3 months

## 2023-03-07 NOTE — Assessment & Plan Note (Signed)
Lab Results  Component Value Date   LDLCALC 112 (H) 01/30/2023  Currently on atorvastatin 20 mg daily Checking lipid panel today

## 2023-03-07 NOTE — Assessment & Plan Note (Signed)
Notes taking ferrous sulfate due to breaking out in rashes She has upcoming appointment with a hematologist, she was encouraged to keep that appointment

## 2023-03-07 NOTE — Patient Instructions (Signed)
Goal for fasting blood sugar ranges from 80 to 120 and 2 hours after any meal or at bedtime should be between 130 to 170.    Controlled type 2 diabetes mellitus with hyperglycemia, without long-term current use of insulin (HCC)  - Lipid panel - Ambulatory referral to Ophthalmology - Glucose Blood (BLOOD GLUCOSE TEST STRIPS) STRP; 1 each by In Vitro route in the morning, at noon, and at bedtime. May substitute to any manufacturer covered by patient's insurance.  Dispense: 100 strip; Refill: 0 - Lancet Device MISC; 1 each by Does not apply route in the morning, at noon, and at bedtime. May substitute to any manufacturer covered by patient's insurance.  Dispense: 1 each; Refill: 0 - Lancets Misc. MISC; 1 each by Does not apply route in the morning, at noon, and at bedtime. May substitute to any manufacturer covered by patient's insurance.  Dispense: 100 each; Refill: 0 - Semaglutide,0.25 or 0.5MG /DOS, 2 MG/3ML SOPN; Inject 0.5 mg into the skin once a week.  Dispense: 3 mL; Refill: 3    It is important that you exercise regularly at least 30 minutes 5 times a week as tolerated  Think about what you will eat, plan ahead. Choose " clean, green, fresh or frozen" over canned, processed or packaged foods which are more sugary, salty and fatty. 70 to 75% of food eaten should be vegetables and fruit. Three meals at set times with snacks allowed between meals, but they must be fruit or vegetables. Aim to eat over a 12 hour period , example 7 am to 7 pm, and STOP after  your last meal of the day. Drink water,generally about 64 ounces per day, no other drink is as healthy. Fruit juice is best enjoyed in a healthy way, by EATING the fruit.  Thanks for choosing [facility] we consider it a privelige to serve you.

## 2023-03-07 NOTE — Assessment & Plan Note (Signed)
BP Readings from Last 3 Encounters:  03/07/23 137/76  01/30/23 132/84  01/09/23 127/77  Blood pressure is slightly elevated but she has not taking her medications today  ,continue amlodipine 5 mg daily, metoprolol 25 mg daily, valsartan 80 mg daily Blood pressure goal of less than 130/80 discussed Continue current medications. No changes in management. Discussed DASH diet and dietary sodium restrictions Continue to increase dietary efforts and exercise.

## 2023-03-08 LAB — BASIC METABOLIC PANEL
BUN/Creatinine Ratio: 11 (ref 9–23)
BUN: 11 mg/dL (ref 6–20)
CO2: 22 mmol/L (ref 20–29)
Calcium: 9 mg/dL (ref 8.7–10.2)
Chloride: 104 mmol/L (ref 96–106)
Creatinine, Ser: 0.96 mg/dL (ref 0.57–1.00)
Glucose: 97 mg/dL (ref 70–99)
Potassium: 4.3 mmol/L (ref 3.5–5.2)
Sodium: 140 mmol/L (ref 134–144)
eGFR: 82 mL/min/{1.73_m2} (ref >59)

## 2023-03-08 LAB — LIPID PANEL
Chol/HDL Ratio: 3.3 {ratio} (ref 0.0–4.4)
Cholesterol, Total: 131 mg/dL (ref 100–199)
HDL: 40 mg/dL (ref >39)
LDL Chol Calc (NIH): 78 mg/dL (ref 0–99)
Triglycerides: 62 mg/dL (ref 0–149)
VLDL Cholesterol Cal: 13 mg/dL (ref 5–40)
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
## 2023-04-20 ENCOUNTER — Inpatient Hospital Stay: Payer: BC Managed Care – PPO | Attending: Nurse Practitioner

## 2023-04-20 ENCOUNTER — Other Ambulatory Visit: Payer: Self-pay | Admitting: Nurse Practitioner

## 2023-04-20 ENCOUNTER — Encounter: Payer: Self-pay | Admitting: Nurse Practitioner

## 2023-04-20 ENCOUNTER — Inpatient Hospital Stay (HOSPITAL_BASED_OUTPATIENT_CLINIC_OR_DEPARTMENT_OTHER): Payer: BC Managed Care – PPO | Admitting: Nurse Practitioner

## 2023-04-20 VITALS — BP 137/101 | HR 107 | Temp 98.1°F | Resp 18 | Ht 65.0 in | Wt 322.7 lb

## 2023-04-20 DIAGNOSIS — Z803 Family history of malignant neoplasm of breast: Secondary | ICD-10-CM | POA: Diagnosis not present

## 2023-04-20 DIAGNOSIS — D573 Sickle-cell trait: Secondary | ICD-10-CM | POA: Diagnosis not present

## 2023-04-20 DIAGNOSIS — E7800 Pure hypercholesterolemia, unspecified: Secondary | ICD-10-CM

## 2023-04-20 DIAGNOSIS — D72820 Lymphocytosis (symptomatic): Secondary | ICD-10-CM | POA: Diagnosis not present

## 2023-04-20 DIAGNOSIS — D509 Iron deficiency anemia, unspecified: Secondary | ICD-10-CM | POA: Insufficient documentation

## 2023-04-20 DIAGNOSIS — I10 Essential (primary) hypertension: Secondary | ICD-10-CM | POA: Diagnosis not present

## 2023-04-20 DIAGNOSIS — E282 Polycystic ovarian syndrome: Secondary | ICD-10-CM

## 2023-04-20 DIAGNOSIS — Z806 Family history of leukemia: Secondary | ICD-10-CM | POA: Insufficient documentation

## 2023-04-20 DIAGNOSIS — D72829 Elevated white blood cell count, unspecified: Secondary | ICD-10-CM | POA: Diagnosis not present

## 2023-04-20 DIAGNOSIS — D75839 Thrombocytosis, unspecified: Secondary | ICD-10-CM

## 2023-04-20 DIAGNOSIS — Z807 Family history of other malignant neoplasms of lymphoid, hematopoietic and related tissues: Secondary | ICD-10-CM | POA: Diagnosis not present

## 2023-04-20 LAB — CBC WITH DIFFERENTIAL (CANCER CENTER ONLY)
Abs Immature Granulocytes: 0.09 10*3/uL — ABNORMAL HIGH (ref 0.00–0.07)
Basophils Absolute: 0 10*3/uL (ref 0.0–0.1)
Basophils Relative: 0 %
Eosinophils Absolute: 0.1 10*3/uL (ref 0.0–0.5)
Eosinophils Relative: 1 %
HCT: 36.5 % (ref 36.0–46.0)
Hemoglobin: 11.3 g/dL — ABNORMAL LOW (ref 12.0–15.0)
Immature Granulocytes: 1 %
Lymphocytes Relative: 28 %
Lymphs Abs: 4.1 10*3/uL — ABNORMAL HIGH (ref 0.7–4.0)
MCH: 21.6 pg — ABNORMAL LOW (ref 26.0–34.0)
MCHC: 31 g/dL (ref 30.0–36.0)
MCV: 69.7 fL — ABNORMAL LOW (ref 80.0–100.0)
Monocytes Absolute: 0.5 10*3/uL (ref 0.1–1.0)
Monocytes Relative: 4 %
Neutro Abs: 9.8 10*3/uL — ABNORMAL HIGH (ref 1.7–7.7)
Neutrophils Relative %: 66 %
Platelet Count: 488 10*3/uL — ABNORMAL HIGH (ref 150–400)
RBC: 5.24 MIL/uL — ABNORMAL HIGH (ref 3.87–5.11)
RDW: 19.4 % — ABNORMAL HIGH (ref 11.5–15.5)
WBC Count: 14.7 10*3/uL — ABNORMAL HIGH (ref 4.0–10.5)
nRBC: 0 % (ref 0.0–0.2)

## 2023-04-20 LAB — IRON AND TIBC
Iron: 16 ug/dL — ABNORMAL LOW (ref 28–170)
Saturation Ratios: 5 % — ABNORMAL LOW (ref 10.4–31.8)
TIBC: 323 ug/dL (ref 250–450)
UIBC: 307 ug/dL

## 2023-04-20 LAB — FERRITIN: Ferritin: 25 ng/mL (ref 11–307)

## 2023-04-20 LAB — SAVE SMEAR(SSMR), FOR PROVIDER SLIDE REVIEW

## 2023-04-30 ENCOUNTER — Other Ambulatory Visit: Payer: Self-pay | Admitting: Nurse Practitioner

## 2023-04-30 DIAGNOSIS — E785 Hyperlipidemia, unspecified: Secondary | ICD-10-CM

## 2023-05-13 ENCOUNTER — Other Ambulatory Visit: Payer: Self-pay | Admitting: Nurse Practitioner

## 2023-05-13 DIAGNOSIS — E1165 Type 2 diabetes mellitus with hyperglycemia: Secondary | ICD-10-CM

## 2023-05-14 ENCOUNTER — Encounter: Payer: BC Managed Care – PPO | Admitting: Bariatrics

## 2023-06-08 ENCOUNTER — Ambulatory Visit: Payer: Self-pay | Admitting: Nurse Practitioner

## 2023-06-11 ENCOUNTER — Encounter: Payer: BC Managed Care – PPO | Admitting: Nurse Practitioner

## 2023-06-25 ENCOUNTER — Encounter: Payer: Self-pay | Admitting: Nurse Practitioner

## 2023-06-25 ENCOUNTER — Ambulatory Visit (INDEPENDENT_AMBULATORY_CARE_PROVIDER_SITE_OTHER): Payer: Medicaid Other | Admitting: Nurse Practitioner

## 2023-06-25 VITALS — BP 131/81 | HR 91 | Temp 97.4°F | Wt 318.0 lb

## 2023-06-25 DIAGNOSIS — Z6841 Body Mass Index (BMI) 40.0 and over, adult: Secondary | ICD-10-CM | POA: Diagnosis not present

## 2023-06-25 DIAGNOSIS — D509 Iron deficiency anemia, unspecified: Secondary | ICD-10-CM

## 2023-06-25 DIAGNOSIS — E785 Hyperlipidemia, unspecified: Secondary | ICD-10-CM

## 2023-06-25 DIAGNOSIS — E66813 Obesity, class 3: Secondary | ICD-10-CM

## 2023-06-25 DIAGNOSIS — E1165 Type 2 diabetes mellitus with hyperglycemia: Secondary | ICD-10-CM

## 2023-06-25 DIAGNOSIS — I10 Essential (primary) hypertension: Secondary | ICD-10-CM

## 2023-06-25 LAB — POCT GLYCOSYLATED HEMOGLOBIN (HGB A1C): Hemoglobin A1C: 6.2 % — AB (ref 4.0–5.6)

## 2023-06-25 MED ORDER — VALSARTAN 80 MG PO TABS: 80.0000 mg | ORAL_TABLET | Freq: Every day | ORAL | 1 refills | Status: DC

## 2023-06-25 MED ORDER — AMLODIPINE BESYLATE 5 MG PO TABS: 5.0000 mg | ORAL_TABLET | Freq: Every day | ORAL | 1 refills | Status: DC

## 2023-06-25 MED ORDER — SEMAGLUTIDE (1 MG/DOSE) 4 MG/3ML ~~LOC~~ SOPN: 1.0000 mg | PEN_INJECTOR | SUBCUTANEOUS | 3 refills | Status: DC

## 2023-06-25 MED ORDER — ATORVASTATIN CALCIUM 20 MG PO TABS: 20.0000 mg | ORAL_TABLET | Freq: Every day | ORAL | 0 refills | Status: DC

## 2023-06-25 NOTE — Assessment & Plan Note (Signed)
Continue amlodipine 5 mg daily, valsartan 80 mg daily Encouraged to monitor blood pressure at home blood pressure goal is less than 130/80 DASH diet and commitment to daily physical activity for a minimum of 30 minutes discussed and encouraged, as a part of hypertension management. The importance of attaining a healthy weight is also discussed.     06/25/2023   11:40 AM 06/25/2023   11:20 AM 04/20/2023   11:16 AM 03/07/2023   11:19 AM 03/07/2023   10:57 AM 03/07/2023   10:54 AM 01/30/2023    9:34 AM  BP/Weight  Systolic BP 131 138 137 137 135 135 132  Diastolic BP 81 89 101 76 85 95 84  Wt. (Lbs)  318 322.7   322.8   BMI  52.92 kg/m2 53.7 kg/m2   53.72 kg/m2

## 2023-06-25 NOTE — Assessment & Plan Note (Signed)
Currently well-controlled Increase Ozempic to 1 mg once weekly Patient counseled on low-carb diet Encouraged engage in regular moderate to vigorous exercise at least 150 minutes weekly Patient encouraged to get your diabetes eye exam completed

## 2023-06-25 NOTE — Assessment & Plan Note (Signed)
Lab Results  Component Value Date   CHOL 131 03/07/2023   HDL 40 03/07/2023   LDLCALC 78 03/07/2023   TRIG 62 03/07/2023   CHOLHDL 3.3 03/07/2023   Well controlled on atorvastatin 20mg  daily  Continue current medication
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
## 2023-06-25 NOTE — Patient Instructions (Addendum)
1. Dyslipidemia, goal LDL below 70  - atorvastatin (LIPITOR) 20 MG tablet; Take 1 tablet (20 mg total) by mouth daily.  Dispense: 90 tablet; Refill: 0  2. Primary hypertension  - valsartan (DIOVAN) 80 MG tablet; Take 1 tablet (80 mg total) by mouth daily.  Dispense: 90 tablet; Refill: 1 - Basic Metabolic Panel  3. Controlled type 2 diabetes mellitus with hyperglycemia, without long-term current use of insulin (HCC)  - valsartan (DIOVAN) 80 MG tablet; Take 1 tablet (80 mg total) by mouth daily.  Dispense: 90 tablet; Refill: 1 - POCT glycosylated hemoglobin (Hb A1C) - Semaglutide, 1 MG/DOSE, 4 MG/3ML SOPN; Inject 1 mg as directed once a week.  Dispense: 3 mL; Refill: 3  4. Class 3 severe obesity due to excess calories with serious comorbidity and body mass index (BMI) of 50.0 to 59.9 in adult [facility]) (Primary)  Your blood pressure goal is less than 130/80    It is important that you exercise regularly at least 30 minutes 5 times a week as tolerated  Think about what you will eat, plan ahead. Choose " clean, green, fresh or frozen" over canned, processed or packaged foods which are more sugary, salty and fatty. 70 to 75% of food eaten should be vegetables and fruit. Three meals at set times with snacks allowed between meals, but they must be fruit or vegetables. Aim to eat over a 12 hour period , example 7 am to 7 pm, and STOP after  your last meal of the day. Drink water,generally about 64 ounces per day, no other drink is as healthy. Fruit juice is best enjoyed in a healthy way, by EATING the fruit.  Thanks for choosing [facility] we consider it a privelige to serve you.

## 2023-06-25 NOTE — Assessment & Plan Note (Addendum)
Wt Readings from Last 3 Encounters:  06/25/23 (!) 318 lb (144.2 kg)  04/20/23 (!) 322 lb 11.2 oz (146.4 kg)  03/07/23 (!) 322 lb 12.8 oz (146.4 kg)   Body mass index is 52.92 kg/m.  Patient has lost 4 pounds since last visit Continue Ozempic will increase Ozempic to 1 mg once weekly Patient counseled on low-carb diet, encouraged to engage in regular moderate-vigorous exercises at least at least 150 minutes weekly We discussed bariatric surgery but the patient would like to continue exercise diet and medication for now.  their goal is to be under 300lb by next visit

## 2023-06-25 NOTE — Assessment & Plan Note (Signed)
Followed by hematologist, they have stopped taking ferrous sulfate, they plan discussing taking iron infusion at their next visit with hematologist Lab Results  Component Value Date   WBC 14.7 (H) 04/20/2023   HGB 11.3 (L) 04/20/2023   HCT 36.5 04/20/2023   MCV 69.7 (L) 04/20/2023   PLT 488 (H) 04/20/2023

## 2023-06-26 LAB — BASIC METABOLIC PANEL
BUN/Creatinine Ratio: 15 (ref 9–23)
BUN: 15 mg/dL (ref 6–20)
CO2: 23 mmol/L (ref 20–29)
Calcium: 8.6 mg/dL — ABNORMAL LOW (ref 8.7–10.2)
Chloride: 102 mmol/L (ref 96–106)
Creatinine, Ser: 1.03 mg/dL — ABNORMAL HIGH (ref 0.57–1.00)
Glucose: 104 mg/dL — ABNORMAL HIGH (ref 70–99)
Potassium: 3.9 mmol/L (ref 3.5–5.2)
Sodium: 138 mmol/L (ref 134–144)
eGFR: 75 mL/min/{1.73_m2} (ref >59)

## 2023-07-18 ENCOUNTER — Other Ambulatory Visit: Payer: Self-pay

## 2023-07-18 DIAGNOSIS — D649 Anemia, unspecified: Secondary | ICD-10-CM

## 2023-07-19 ENCOUNTER — Other Ambulatory Visit: Payer: BC Managed Care – PPO

## 2023-07-19 ENCOUNTER — Inpatient Hospital Stay: Payer: Medicaid Other | Admitting: Nurse Practitioner

## 2023-07-19 ENCOUNTER — Inpatient Hospital Stay: Payer: Medicaid Other

## 2023-09-22 ENCOUNTER — Other Ambulatory Visit: Payer: Self-pay | Admitting: Nurse Practitioner

## 2023-09-22 DIAGNOSIS — I10 Essential (primary) hypertension: Secondary | ICD-10-CM

## 2023-09-25 ENCOUNTER — Encounter: Payer: Self-pay | Admitting: Nurse Practitioner

## 2023-09-25 ENCOUNTER — Ambulatory Visit: Payer: Self-pay | Admitting: Nurse Practitioner

## 2023-09-25 VITALS — BP 142/88 | HR 100 | Temp 97.2°F | Wt 308.0 lb

## 2023-09-25 DIAGNOSIS — I10 Essential (primary) hypertension: Secondary | ICD-10-CM

## 2023-09-25 DIAGNOSIS — Z6841 Body Mass Index (BMI) 40.0 and over, adult: Secondary | ICD-10-CM | POA: Diagnosis not present

## 2023-09-25 DIAGNOSIS — E1165 Type 2 diabetes mellitus with hyperglycemia: Secondary | ICD-10-CM | POA: Diagnosis not present

## 2023-09-25 DIAGNOSIS — E785 Hyperlipidemia, unspecified: Secondary | ICD-10-CM

## 2023-09-25 DIAGNOSIS — E66813 Obesity, class 3: Secondary | ICD-10-CM | POA: Diagnosis not present

## 2023-09-25 DIAGNOSIS — D508 Other iron deficiency anemias: Secondary | ICD-10-CM | POA: Diagnosis not present

## 2023-09-25 LAB — POCT GLYCOSYLATED HEMOGLOBIN (HGB A1C): Hemoglobin A1C: 5.6 % (ref 4.0–5.6)

## 2023-09-25 LAB — HEMOGLOBIN A1C: Hemoglobin A1C: 5.6

## 2023-09-25 MED ORDER — VALSARTAN 80 MG PO TABS: 80.0000 mg | ORAL_TABLET | Freq: Every day | ORAL | 1 refills | Status: DC

## 2023-09-25 MED ORDER — AMLODIPINE BESYLATE 5 MG PO TABS
5.0000 mg | ORAL_TABLET | Freq: Every day | ORAL | 1 refills | Status: DC
Start: 2023-09-25 — End: 2024-02-26

## 2023-09-25 MED ORDER — ATORVASTATIN CALCIUM 20 MG PO TABS: 20.0000 mg | ORAL_TABLET | Freq: Every day | ORAL | 1 refills | Status: DC

## 2023-09-25 MED ORDER — SEMAGLUTIDE (1 MG/DOSE) 4 MG/3ML ~~LOC~~ SOPN: 1.0000 mg | PEN_INJECTOR | SUBCUTANEOUS | 5 refills | Status: DC

## 2023-09-25 NOTE — Assessment & Plan Note (Addendum)
 Wt Readings from Last 3 Encounters:  09/25/23 (!) 308 lb (139.7 kg)  06/25/23 (!) 318 lb (144.2 kg)  04/20/23 (!) 322 lb 11.2 oz (146.4 kg)   Body mass index is 51.25 kg/m.  Patient counseled on low-carb diet Encouraged to continue to engage in regular moderate to vigorous exercises at least 150 minutes weekly as tolerated  interested in weight loss surgery referral placed

## 2023-09-25 NOTE — Assessment & Plan Note (Signed)
 Lab Results  Component Value Date   CHOL 131 03/07/2023   HDL 40 03/07/2023   LDLCALC 78 03/07/2023   TRIG 62 03/07/2023   CHOLHDL 3.3 03/07/2023  Well-controlled on atorvastatin  20 mg daily States that she has been out of the medication for some days, she was encouraged to pick up prescription at the pharmacy Continue current medication Avoid fatty fried foods

## 2023-09-25 NOTE — Assessment & Plan Note (Addendum)
 Taking OTC iron supplements Rechecking labs Encouraged to follow-up with hematologist if still interested in iron infusion Lab Results  Component Value Date   WBC 14.7 (H) 04/20/2023   HGB 11.3 (L) 04/20/2023   HCT 36.5 04/20/2023   MCV 69.7 (L) 04/20/2023   PLT 488 (H) 04/20/2023

## 2023-09-25 NOTE — Patient Instructions (Signed)
 Please schedule an appointment with OB/GYN to get your cervical cancer screening completed Encourage you to also do your diabetic eye exam. Please schedule an appointment with a hematologist for your anemia as discussed   1. Primary hypertension  - valsartan  (DIOVAN ) 80 MG tablet; Take 1 tablet (80 mg total) by mouth daily.  Dispense: 90 tablet; Refill: 1 - amLODipine  (NORVASC ) 5 MG tablet; Take 1 tablet (5 mg total) by mouth daily.  Dispense: 90 tablet; Refill: 1 - Recheck vitals  2. Controlled type 2 diabetes mellitus with hyperglycemia, without long-term current use of insulin (HCC)  - POCT glycosylated hemoglobin (Hb A1C) - valsartan  (DIOVAN ) 80 MG tablet; Take 1 tablet (80 mg total) by mouth daily.  Dispense: 90 tablet; Refill: 1 - Lipid panel - Basic Metabolic Panel - Semaglutide , 1 MG/DOSE, 4 MG/3ML SOPN; Inject 1 mg as directed once a week.  Dispense: 3 mL; Refill: 5  3. Dyslipidemia, goal LDL below 70  - atorvastatin  (LIPITOR) 20 MG tablet; Take 1 tablet (20 mg total) by mouth daily.  Dispense: 90 tablet; Refill: 1 - Lipid panel  4. Class 3 severe obesity due to excess calories without serious comorbidity with body mass index (BMI) of 50.0 to 59.9 in adult [facility]) (Primary)  - Amb Referral to Bariatric Surgery  5. Other iron deficiency anemia  - CBC with Differential/Platelet - Iron, TIBC and Ferritin Panel     Around 3 times per week, check your blood pressure 2 times per day. once in the morning and once in the evening. The readings should be at least one minute apart. Write down these values and bring them to your next nurse visit/appointment.  When you check your BP, make sure you have been doing something calm/relaxing 5 minutes prior to checking. Both feet should be flat on the floor and you should be sitting. Use your left arm and make sure it is in a relaxed position (on a table), and that the cuff is at the approximate level/height of your heart.  Blood pressure  goal is less than 130/80  It is important that you exercise regularly at least 30 minutes 5 times a week as tolerated  Think about what you will eat, plan ahead. Choose " clean, green, fresh or frozen" over canned, processed or packaged foods which are more sugary, salty and fatty. 70 to 75% of food eaten should be vegetables and fruit. Three meals at set times with snacks allowed between meals, but they must be fruit or vegetables. Aim to eat over a 12 hour period , example 7 am to 7 pm, and STOP after  your last meal of the day. Drink water,generally about 64 ounces per day, no other drink is as healthy. Fruit juice is best enjoyed in a healthy way, by EATING the fruit.  Thanks for choosing [facility] we consider it a privelige to serve you.

## 2023-09-25 NOTE — Assessment & Plan Note (Signed)
 Continue amlodipine  5 mg daily, metoprolol  25 mg daily, valsartan  80 mg daily refilled Nurse visit in 4 weeks to recheck blood pressure, blood pressure goal is less than 130/80 DASH diet and commitment to daily physical activity for a minimum of 30 minutes discussed and encouraged, as a part of hypertension management. The importance of attaining a healthy weight is also discussed.     09/25/2023   10:38 AM 09/25/2023   10:25 AM 06/25/2023   11:40 AM 06/25/2023   11:20 AM 04/20/2023   11:16 AM 03/07/2023   11:19 AM 03/07/2023   10:57 AM  BP/Weight  Systolic BP 142 145 131 138 137 137 135  Diastolic BP 88 65 81 89 101 76 85  Wt. (Lbs)  308  318 322.7    BMI  51.25 kg/m2  52.92 kg/m2 53.7 kg/m2
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
## 2023-09-25 NOTE — Assessment & Plan Note (Signed)
 Lab Results  Component Value Date   HGBA1C 5.6 09/25/2023  Chronic medical condition currently well-controlled Continue Ozempic  1 mg once weekly Continue low-carb diet, engage in regular moderate to vigorous exercise at least 150 minutes weekly as tolerated Due for diabetes eye exam ,encouraged to get diabetes eye exam completed Follow-up in 6 months

## 2023-09-26 LAB — CBC WITH DIFFERENTIAL/PLATELET
Basophils Absolute: 0.1 10*3/uL (ref 0.0–0.2)
Basos: 0 %
EOS (ABSOLUTE): 0.2 10*3/uL (ref 0.0–0.4)
Eos: 1 %
Hematocrit: 41 % (ref 34.0–46.6)
Hemoglobin: 12.6 g/dL (ref 11.1–15.9)
Immature Grans (Abs): 0 10*3/uL (ref 0.0–0.1)
Immature Granulocytes: 0 %
Lymphocytes Absolute: 3.9 10*3/uL — ABNORMAL HIGH (ref 0.7–3.1)
Lymphs: 27 %
MCH: 21.9 pg — ABNORMAL LOW (ref 26.6–33.0)
MCHC: 30.7 g/dL — ABNORMAL LOW (ref 31.5–35.7)
MCV: 71 fL — ABNORMAL LOW (ref 79–97)
Monocytes Absolute: 0.7 10*3/uL (ref 0.1–0.9)
Monocytes: 5 %
Neutrophils Absolute: 9.6 10*3/uL — ABNORMAL HIGH (ref 1.4–7.0)
Neutrophils: 67 %
Platelets: 522 10*3/uL — ABNORMAL HIGH (ref 150–450)
RBC: 5.75 x10E6/uL — ABNORMAL HIGH (ref 3.77–5.28)
RDW: 18.7 % — ABNORMAL HIGH (ref 11.7–15.4)
WBC: 14.4 10*3/uL — ABNORMAL HIGH (ref 3.4–10.8)

## 2023-09-26 LAB — BASIC METABOLIC PANEL WITH GFR
BUN/Creatinine Ratio: 9 (ref 9–23)
BUN: 9 mg/dL (ref 6–20)
CO2: 23 mmol/L (ref 20–29)
Calcium: 8.8 mg/dL (ref 8.7–10.2)
Chloride: 99 mmol/L (ref 96–106)
Creatinine, Ser: 0.99 mg/dL (ref 0.57–1.00)
Glucose: 92 mg/dL (ref 70–99)
Potassium: 3.7 mmol/L (ref 3.5–5.2)
Sodium: 137 mmol/L (ref 134–144)
eGFR: 79 mL/min/{1.73_m2} (ref >59)

## 2023-09-26 LAB — IRON,TIBC AND FERRITIN PANEL
Ferritin: 45 ng/mL (ref 15–150)
Iron Saturation: 8 % — CL (ref 15–55)
Iron: 22 ug/dL — ABNORMAL LOW (ref 27–159)
Total Iron Binding Capacity: 278 ug/dL (ref 250–450)
UIBC: 256 ug/dL (ref 131–425)

## 2023-09-26 LAB — LIPID PANEL
Chol/HDL Ratio: 3.7 ratio (ref 0.0–4.4)
Cholesterol, Total: 183 mg/dL (ref 100–199)
HDL: 49 mg/dL (ref >39)
LDL Chol Calc (NIH): 118 mg/dL — ABNORMAL HIGH (ref 0–99)
Triglycerides: 89 mg/dL (ref 0–149)
VLDL Cholesterol Cal: 16 mg/dL (ref 5–40)

## 2023-10-23 ENCOUNTER — Ambulatory Visit: Payer: Self-pay

## 2024-02-26 ENCOUNTER — Other Ambulatory Visit: Payer: Self-pay | Admitting: Nurse Practitioner

## 2024-02-26 ENCOUNTER — Encounter: Payer: Self-pay | Admitting: Nurse Practitioner

## 2024-02-26 ENCOUNTER — Ambulatory Visit (INDEPENDENT_AMBULATORY_CARE_PROVIDER_SITE_OTHER): Payer: Self-pay | Admitting: Nurse Practitioner

## 2024-02-26 VITALS — BP 122/73 | HR 100 | Temp 97.3°F | Wt 327.0 lb

## 2024-02-26 DIAGNOSIS — I10 Essential (primary) hypertension: Secondary | ICD-10-CM

## 2024-02-26 DIAGNOSIS — E785 Hyperlipidemia, unspecified: Secondary | ICD-10-CM

## 2024-02-26 DIAGNOSIS — D509 Iron deficiency anemia, unspecified: Secondary | ICD-10-CM

## 2024-02-26 DIAGNOSIS — E66813 Obesity, class 3: Secondary | ICD-10-CM

## 2024-02-26 DIAGNOSIS — E1165 Type 2 diabetes mellitus with hyperglycemia: Secondary | ICD-10-CM

## 2024-02-26 LAB — POCT GLYCOSYLATED HEMOGLOBIN (HGB A1C): Hemoglobin A1C: 6.4 % — AB (ref 4.0–5.6)

## 2024-02-26 MED ORDER — METOPROLOL SUCCINATE ER 25 MG PO TB24: 25.0000 mg | ORAL_TABLET | Freq: Every day | ORAL | 1 refills | Status: DC

## 2024-02-26 MED ORDER — METFORMIN HCL ER 500 MG PO TB24: 500.0000 mg | ORAL_TABLET | Freq: Every day | ORAL | 1 refills | Status: DC

## 2024-02-26 MED ORDER — AMLODIPINE BESYLATE 5 MG PO TABS: 5.0000 mg | ORAL_TABLET | Freq: Every day | ORAL | 1 refills | Status: DC

## 2024-02-26 MED ORDER — VALSARTAN 80 MG PO TABS: 80.0000 mg | ORAL_TABLET | Freq: Every day | ORAL | 1 refills | Status: DC
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
## 2024-02-26 NOTE — Patient Instructions (Addendum)
 [facility] [address] [place] [address]  P:  [phone] F:  [phone]    1. Primary hypertension  - CMP14+EGFR  2. Controlled type 2 diabetes mellitus with hyperglycemia, without long-term current use of insulin (HCC)  - Microalbumin/Creatinine Ratio, Urine - POCT glycosylated hemoglobin (Hb A1C) - Direct LDL - metFORMIN  (GLUCOPHAGE -XR) 500 MG 24 hr tablet; Take 1 tablet (500 mg total) by mouth daily with breakfast.  Dispense: 60 tablet; Refill: 1 - Ambulatory referral to Ophthalmology  3. Class 3 severe obesity due to excess calories in adult (Primary)   4. Iron deficiency anemia, unspecified iron deficiency anemia type  - CBC - Iron, TIBC and Ferritin Panel     It is important that you exercise regularly at least 30 minutes 5 times a week as tolerated  Think about what you will eat, plan ahead. Choose  clean, green, fresh or frozen over canned, processed or packaged foods which are more sugary, salty and fatty. 70 to 75% of food eaten should be vegetables and fruit. Three meals at set times with snacks allowed between meals, but they must be fruit or vegetables. Aim to eat over a 12 hour period , example 7 am to 7 pm, and STOP after  your last meal of the day. Drink water,generally about 64 ounces per day, no other drink is as healthy. Fruit juice is best enjoyed in a healthy way, by EATING the fruit.  Thanks for choosing [facility] we consider it a privelige to serve you.

## 2024-02-26 NOTE — Assessment & Plan Note (Addendum)
 Lab Results  Component Value Date   HGBA1C 6.4 (A) 02/26/2024   A1c increased to 6.4% from 5.6% over five months. Interested in starting metformin  for blood sugar management. - Prescribe metformin  500 mg once daily. Avoid sugar sweets soda, lose weight Need for diabetic eye exam discussed encouraged to get the test completed

## 2024-02-26 NOTE — Assessment & Plan Note (Signed)
 Lab Results  Component Value Date   CHOL 183 09/25/2023   HDL 49 09/25/2023   LDLCALC 118 (H) 09/25/2023   TRIG 89 09/25/2023   CHOLHDL 3.7 09/25/2023    Inconsistent atorvastatin  intake. - Encourage consistent daily intake of atorvastatin  20 mg daily - Order cholesterol level labs.

## 2024-02-26 NOTE — Assessment & Plan Note (Signed)
 BP Readings from Last 3 Encounters:  02/26/24 122/73  09/25/23 (!) 142/88  06/25/23 131/81   HTN Controlled .  On metoprolol  25 mg daily, amlodipine  5 mg daily, valsartan  80 mg daily Continue current medications. No changes in management. Discussed DASH diet and dietary sodium restrictions Continue to increase dietary efforts and exercise.

## 2024-02-26 NOTE — Assessment & Plan Note (Addendum)
 Wt Readings from Last 3 Encounters:  02/26/24 (!) 327 lb (148.3 kg)  09/25/23 (!) 308 lb (139.7 kg)  06/25/23 (!) 318 lb (144.2 kg)   Body mass index is 54.42 kg/m.  Class 3 severe obesity due to excess calories in adult Weight gain of 19 pounds post-semaglutide  discontinuation. Hesitant about surgery and uninterested in resuming semaglutide . Considering phentermine with discussion on addiction and weight regain risks. - Encourage 30 minutes of moderate to vigorous exercise 5 days a week. - Advise dietary modifications: 70-80% vegetables and protein, reduced carbohydrates, water instead of soda or juice. - Discuss weight loss surgery as an option.,discussed  contrave as another option, will provide samples when available

## 2024-02-26 NOTE — Assessment & Plan Note (Signed)
 Lab Results  Component Value Date   WBC 14.4 (H) 09/25/2023   HGB 12.6 09/25/2023   HCT 41.0 09/25/2023   MCV 71 (L) 09/25/2023   PLT 522 (H) 09/25/2023   Lab Results  Component Value Date   IRON 22 (L) 09/25/2023   TIBC 278 09/25/2023   FERRITIN 45 09/25/2023     not taking iron supplements. Open to iron pills with orange juice for absorption. Concerned about cost of iron infusion. - Order labs to monitor iron levels.

## 2024-02-27 ENCOUNTER — Ambulatory Visit: Payer: Self-pay | Admitting: Nurse Practitioner

## 2024-02-27 ENCOUNTER — Other Ambulatory Visit: Payer: Self-pay | Admitting: Nurse Practitioner

## 2024-02-27 DIAGNOSIS — E785 Hyperlipidemia, unspecified: Secondary | ICD-10-CM

## 2024-02-27 LAB — CMP14+EGFR
ALT: 15 IU/L (ref 0–32)
AST: 15 IU/L (ref 0–40)
Albumin: 3.9 g/dL — ABNORMAL LOW (ref 4.0–5.0)
Alkaline Phosphatase: 112 IU/L (ref 41–116)
BUN/Creatinine Ratio: 13 (ref 9–23)
BUN: 12 mg/dL (ref 6–20)
Bilirubin Total: 0.4 mg/dL (ref 0.0–1.2)
CO2: 23 mmol/L (ref 20–29)
Calcium: 8.9 mg/dL (ref 8.7–10.2)
Chloride: 101 mmol/L (ref 96–106)
Creatinine, Ser: 0.96 mg/dL (ref 0.57–1.00)
Globulin, Total: 3.2 g/dL (ref 1.5–4.5)
Glucose: 90 mg/dL (ref 70–99)
Potassium: 3.8 mmol/L (ref 3.5–5.2)
Sodium: 139 mmol/L (ref 134–144)
Total Protein: 7.1 g/dL (ref 6.0–8.5)
eGFR: 82 mL/min/1.73 (ref >59)

## 2024-02-27 LAB — MICROALBUMIN / CREATININE URINE RATIO
Creatinine, Urine: 203.2 mg/dL
Microalb/Creat Ratio: 12 mg/g{creat} (ref 0–29)
Microalbumin, Urine: 23.8 ug/mL

## 2024-02-27 LAB — IRON,TIBC AND FERRITIN PANEL
Ferritin: 43 ng/mL (ref 15–150)
Iron Saturation: 10 % — ABNORMAL LOW (ref 15–55)
Iron: 28 ug/dL (ref 27–159)
Total Iron Binding Capacity: 293 ug/dL (ref 250–450)
UIBC: 265 ug/dL (ref 131–425)

## 2024-02-27 LAB — CBC
Hematocrit: 37.8 % (ref 34.0–46.6)
Hemoglobin: 11.6 g/dL (ref 11.1–15.9)
MCH: 22.4 pg — ABNORMAL LOW (ref 26.6–33.0)
MCHC: 30.7 g/dL — ABNORMAL LOW (ref 31.5–35.7)
MCV: 73 fL — ABNORMAL LOW (ref 79–97)
Platelets: 433 x10E3/uL (ref 150–450)
RBC: 5.17 x10E6/uL (ref 3.77–5.28)
RDW: 17.7 % — ABNORMAL HIGH (ref 11.7–15.4)
WBC: 15.7 x10E3/uL — ABNORMAL HIGH (ref 3.4–10.8)

## 2024-02-27 LAB — LDL CHOLESTEROL, DIRECT: LDL Direct: 118 mg/dL — ABNORMAL HIGH (ref 0–99)

## 2024-02-27 MED ORDER — ATORVASTATIN CALCIUM 20 MG PO TABS: 20.0000 mg | ORAL_TABLET | Freq: Every day | ORAL | 1 refills | Status: AC

## 2024-03-17 ENCOUNTER — Other Ambulatory Visit: Payer: Self-pay | Admitting: Nurse Practitioner

## 2024-03-17 DIAGNOSIS — E1165 Type 2 diabetes mellitus with hyperglycemia: Secondary | ICD-10-CM

## 2024-03-17 DIAGNOSIS — Z6841 Body Mass Index (BMI) 40.0 and over, adult: Secondary | ICD-10-CM

## 2024-03-17 MED ORDER — SEMAGLUTIDE(0.25 OR 0.5MG/DOS) 2 MG/3ML ~~LOC~~ SOPN: 0.5000 mg | PEN_INJECTOR | SUBCUTANEOUS | 0 refills | Status: DC

## 2024-03-17 MED ORDER — SEMAGLUTIDE(0.25 OR 0.5MG/DOS) 2 MG/3ML ~~LOC~~ SOPN: 0.2500 mg | PEN_INJECTOR | SUBCUTANEOUS | 0 refills | Status: DC

## 2024-03-26 ENCOUNTER — Ambulatory Visit: Payer: Self-pay | Admitting: Nurse Practitioner

## 2024-06-19 ENCOUNTER — Encounter (HOSPITAL_BASED_OUTPATIENT_CLINIC_OR_DEPARTMENT_OTHER): Payer: Self-pay

## 2024-06-19 ENCOUNTER — Other Ambulatory Visit: Payer: Self-pay

## 2024-06-19 ENCOUNTER — Emergency Department (HOSPITAL_BASED_OUTPATIENT_CLINIC_OR_DEPARTMENT_OTHER)
Admission: EM | Admit: 2024-06-19 | Discharge: 2024-06-19 | Disposition: A | Discharge status: Home / Self Care | Attending: Emergency Medicine | Admitting: Emergency Medicine

## 2024-06-19 ENCOUNTER — Emergency Department (HOSPITAL_BASED_OUTPATIENT_CLINIC_OR_DEPARTMENT_OTHER)

## 2024-06-19 DIAGNOSIS — I10 Essential (primary) hypertension: Secondary | ICD-10-CM | POA: Insufficient documentation

## 2024-06-19 DIAGNOSIS — R42 Dizziness and giddiness: Secondary | ICD-10-CM | POA: Diagnosis present

## 2024-06-19 DIAGNOSIS — Z79899 Other long term (current) drug therapy: Secondary | ICD-10-CM | POA: Diagnosis not present

## 2024-06-19 DIAGNOSIS — D72829 Elevated white blood cell count, unspecified: Secondary | ICD-10-CM | POA: Diagnosis not present

## 2024-06-19 DIAGNOSIS — E876 Hypokalemia: Secondary | ICD-10-CM | POA: Diagnosis not present

## 2024-06-19 LAB — URINALYSIS, ROUTINE W REFLEX MICROSCOPIC
Bilirubin Urine: NEGATIVE
Glucose, UA: NEGATIVE mg/dL
Hgb urine dipstick: NEGATIVE
Ketones, ur: NEGATIVE mg/dL
Leukocytes,Ua: NEGATIVE
Nitrite: NEGATIVE
Specific Gravity, Urine: 1.02 (ref 1.005–1.030)
pH: 7 (ref 5.0–8.0)

## 2024-06-19 LAB — CBC
HCT: 40.6 % (ref 36.0–46.0)
Hemoglobin: 13 g/dL (ref 12.0–15.0)
MCH: 23.2 pg — ABNORMAL LOW (ref 26.0–34.0)
MCHC: 32 g/dL (ref 30.0–36.0)
MCV: 72.4 fL — ABNORMAL LOW (ref 80.0–100.0)
Platelets: 472 K/uL — ABNORMAL HIGH (ref 150–400)
RBC: 5.61 MIL/uL — ABNORMAL HIGH (ref 3.87–5.11)
RDW: 19.5 % — ABNORMAL HIGH (ref 11.5–15.5)
WBC: 16 K/uL — ABNORMAL HIGH (ref 4.0–10.5)
nRBC: 0 % (ref 0.0–0.2)

## 2024-06-19 LAB — COMPREHENSIVE METABOLIC PANEL WITH GFR
ALT: 21 U/L (ref 0–44)
AST: 18 U/L (ref 15–41)
Albumin: 4.2 g/dL (ref 3.5–5.0)
Alkaline Phosphatase: 124 U/L (ref 38–126)
Anion gap: 12 (ref 5–15)
BUN: 12 mg/dL (ref 6–20)
CO2: 27 mmol/L (ref 22–32)
Calcium: 9 mg/dL (ref 8.9–10.3)
Chloride: 101 mmol/L (ref 98–111)
Creatinine, Ser: 0.92 mg/dL (ref 0.44–1.00)
GFR, Estimated: >60 mL/min
Glucose, Bld: 90 mg/dL (ref 70–99)
Potassium: 3.4 mmol/L — ABNORMAL LOW (ref 3.5–5.1)
Sodium: 140 mmol/L (ref 135–145)
Total Bilirubin: 0.4 mg/dL (ref 0.0–1.2)
Total Protein: 8.2 g/dL — ABNORMAL HIGH (ref 6.5–8.1)

## 2024-06-19 LAB — PREGNANCY, URINE: Preg Test, Ur: NEGATIVE

## 2024-06-19 NOTE — ED Provider Notes (Signed)
 " [place] EMERGENCY DEPARTMENT AT [facility] Provider Note   [name]: [id number] Arrival date & time: 06/19/24  1948     Patient presents with: Dizziness   Christina Bailey is a 34 y.o. adult.   Patient with some mild headaches and dizziness here intermittently in the last 2 days.  Now resolved.  No syncope.  No chest pain or shortness of breath no weakness numbness tingling.  No vision loss.  Maybe worse with movement.  feels like right ear might be stopped up.  History of PCOS anxiety hypertension high cholesterol.  Patient denies any abdominal pain cough sputum production fever chills.  No history of vertigo.  The history is provided by the patient.       Prior to Admission medications  Medication Sig Start Date End Date Taking? Authorizing Provider  amLODipine  (NORVASC ) 5 MG tablet Take 1 tablet (5 mg total) by mouth daily. 02/26/24   Paseda, Folashade R, FNP  atorvastatin  (LIPITOR) 20 MG tablet Take 1 tablet (20 mg total) by mouth daily. 02/27/24   Paseda, Folashade R, FNP  ferrous sulfate  325 (65 FE) MG tablet TAKE 1 TABLET BY MOUTH EVERY DAY 02/27/24   Nichols, Tonya S, NP  metFORMIN  (GLUCOPHAGE -XR) 500 MG 24 hr tablet Take 1 tablet (500 mg total) by mouth daily with breakfast. 02/26/24   Paseda, Folashade R, FNP  metoprolol  succinate (TOPROL -XL) 25 MG 24 hr tablet Take 1 tablet (25 mg total) by mouth daily. 02/26/24   Paseda, Folashade R, FNP  Semaglutide ,0.25 or 0.5MG /DOS, 2 MG/3ML SOPN Inject 0.25 mg into the skin once a week. 03/17/24   Paseda, Folashade R, FNP  Semaglutide ,0.25 or 0.5MG /DOS, 2 MG/3ML SOPN Inject 0.5 mg into the skin once a week. 04/14/24   Paseda, Folashade R, FNP  valsartan  (DIOVAN ) 80 MG tablet Take 1 tablet (80 mg total) by mouth daily. 02/26/24   Paseda, Folashade R, FNP    Allergies: Metformin  and related and Ferrous sulfate     Review of Systems  Updated Vital Signs BP (!) 144/92   Pulse 90   Temp 98 F (36.7 C) (Oral)   Resp 19   Wt (!) 149.7  kg   SpO2 99%   BMI 54.91 kg/m   Physical Exam Vitals and nursing note reviewed.  Constitutional:      General: Christina is not in acute distress.    Appearance: Christina is well-developed. Christina is not ill-appearing.  HENT:     Head: Normocephalic and atraumatic.     Right Ear: Tympanic membrane normal.     Left Ear: Tympanic membrane normal.     Nose: Nose normal.     Mouth/Throat:     Mouth: Mucous membranes are moist.  Eyes:     Conjunctiva/sclera: Conjunctivae normal.     Pupils: Pupils are equal, round, and reactive to light.  Cardiovascular:     Rate and Rhythm: Normal rate and regular rhythm.     Pulses: Normal pulses.     Heart sounds: Normal heart sounds. No murmur heard. Pulmonary:     Effort: Pulmonary effort is normal. No respiratory distress.     Breath sounds: Normal breath sounds.  Abdominal:     Palpations: Abdomen is soft.     Tenderness: There is no abdominal tenderness.  Musculoskeletal:        General: No swelling.     Cervical back: Normal range of motion and neck supple.  Skin:    General: Skin is warm and dry.  Capillary Refill: Capillary refill takes less than 2 seconds.  Neurological:     General: No focal deficit present.     Mental Status: Christina is alert and oriented to person, place, and time.     Cranial Nerves: No cranial nerve deficit.     Sensory: No sensory deficit.     Motor: No weakness.     Coordination: Coordination normal.     Comments: No nystagmus, 5+ out of 5 strength, normal sensation, normal speech normal visual fields normal speech  Psychiatric:        Mood and Affect: Mood normal.     (all labs ordered are listed, but only abnormal results are displayed) Labs Reviewed  COMPREHENSIVE METABOLIC PANEL WITH GFR - Abnormal; Notable for the following components:      Result Value   Potassium 3.4 (*)    Total Protein 8.2 (*)    All other components within normal limits  CBC - Abnormal; Notable for the following components:   WBC 16.0  (*)    RBC 5.61 (*)    MCV 72.4 (*)    MCH 23.2 (*)    RDW 19.5 (*)    Platelets 472 (*)    All other components within normal limits  URINALYSIS, ROUTINE W REFLEX MICROSCOPIC - Abnormal; Notable for the following components:   Protein, ur TRACE (*)    All other components within normal limits  PREGNANCY, URINE    EKG: None  Radiology: CT Head Wo Contrast Result Date: 06/19/2024 EXAM: CT HEAD WITHOUT CONTRAST 06/19/2024 08:26:56 PM TECHNIQUE: CT of the head was performed without the administration of intravenous contrast. Automated exposure control, iterative reconstruction, and/or weight based adjustment of the mA/kV was utilized to reduce the radiation dose to as low as reasonably achievable. COMPARISON: None available. CLINICAL HISTORY: Dizziness and headaches FINDINGS: BRAIN AND VENTRICLES: No acute hemorrhage. No evidence of acute infarct. No hydrocephalus. No extra-axial collection. No mass effect or midline shift. ORBITS: No acute abnormality. SINUSES: No acute abnormality. SOFT TISSUES AND SKULL: No acute soft tissue abnormality. No skull fracture. IMPRESSION: 1. No acute intracranial abnormality. Electronically signed by: oneil devonshire MD 06/19/2024 08:31 PM EST RP Workstation: [id number]     Procedures   Medications Ordered in the ED - No data to display                                  Medical Decision Making Amount and/or Complexity of Data Reviewed Labs: ordered. Radiology: ordered.   Christina Bailey is here with dizziness headache.  Patient with unremarkable vitals.  No fever.  Maybe mildly elevated blood pressure.  History of high blood pressure high cholesterol.  Overall neurological exam is normal.  No obvious signs of nystagmus on exam.  Ear exam is unremarkable.  Does feel some pressure behind the right eardrum.  Differential diagnosis may be some mild vertigo versus less likely head bleed brain mass electrolyte abnormality or anemia.  Patient does admit to  history of iron deficiency.  But has never needed blood transfusion.  Patient overall appears well.  Will check basic labs head CT EKG.  I do suspect likely mild vertigo but will evaluate for electrolyte abnormality and anemia.  I have no concern for stroke or cardiac or pulmonary process at this time.  Will get EKG.  Overall lab work at baseline.  No significant leukocytosis anemia or electrolyte abnormality.  Hemoglobin is at 13.  White counts at baseline.  Urinalysis negative for infection.  Head CT is normal.  EKG shows sinus rhythm.  No ischemic changes.  Ultimately I suspect that this process will resolve on its own.  Possibly may be a mild vertigo.  Recommend hydration and rest.  Follow-up with primary care doctor if not improving or happening more frequently.  Told to return if symptoms worsen including stroke symptoms chest pain weakness numbness tingling vision loss.  Patient discharged in good condition.  This chart was dictated using voice recognition software.  Despite best efforts to proofread,  errors can occur which can change the documentation meaning.      Final diagnoses:  Dizziness    ED Discharge Orders     None          Ruthe Cornet, DO 06/19/24 2200  "

## 2024-06-19 NOTE — ED Triage Notes (Signed)
 Pt arrives with c/o dizziness that started about 2 days ago. Pt reports having a intermittent headache. Pt denies focal weakness, slurred speech, and facial droop.

## 2024-06-30 ENCOUNTER — Encounter: Payer: Self-pay | Admitting: Nurse Practitioner

## 2024-07-08 ENCOUNTER — Ambulatory Visit: Admitting: Nurse Practitioner

## 2024-07-08 VITALS — BP 150/100 | HR 98 | Temp 97.4°F | Wt 339.0 lb

## 2024-07-08 DIAGNOSIS — B353 Tinea pedis: Secondary | ICD-10-CM | POA: Diagnosis not present

## 2024-07-08 DIAGNOSIS — E785 Hyperlipidemia, unspecified: Secondary | ICD-10-CM

## 2024-07-08 DIAGNOSIS — E876 Hypokalemia: Secondary | ICD-10-CM

## 2024-07-08 DIAGNOSIS — I10 Essential (primary) hypertension: Secondary | ICD-10-CM | POA: Diagnosis not present

## 2024-07-08 DIAGNOSIS — E1165 Type 2 diabetes mellitus with hyperglycemia: Secondary | ICD-10-CM

## 2024-07-08 DIAGNOSIS — Z6841 Body Mass Index (BMI) 40.0 and over, adult: Secondary | ICD-10-CM

## 2024-07-08 DIAGNOSIS — D509 Iron deficiency anemia, unspecified: Secondary | ICD-10-CM | POA: Diagnosis not present

## 2024-07-08 DIAGNOSIS — Z0000 Encounter for general adult medical examination without abnormal findings: Secondary | ICD-10-CM | POA: Diagnosis not present

## 2024-07-08 LAB — POCT GLYCOSYLATED HEMOGLOBIN (HGB A1C): Hemoglobin A1C: 6.5 % — AB (ref 4.0–5.6)

## 2024-07-08 MED ORDER — METOPROLOL SUCCINATE ER 25 MG PO TB24: 25.0000 mg | ORAL_TABLET | Freq: Every day | ORAL | 1 refills | Status: AC

## 2024-07-08 MED ORDER — AMLODIPINE BESYLATE 5 MG PO TABS: 5.0000 mg | ORAL_TABLET | Freq: Every day | ORAL | 1 refills | Status: AC

## 2024-07-08 MED ORDER — VALSARTAN 80 MG PO TABS: 80.0000 mg | ORAL_TABLET | Freq: Every day | ORAL | 1 refills | Status: AC

## 2024-07-08 MED ORDER — CLOTRIMAZOLE 1 % EX CREA: 1.0000 | TOPICAL_CREAM | Freq: Two times a day (BID) | CUTANEOUS | 0 refills | Status: AC

## 2024-07-08 MED ORDER — SEMAGLUTIDE(0.25 OR 0.5MG/DOS) 2 MG/3ML ~~LOC~~ SOPN: 0.2500 mg | PEN_INJECTOR | SUBCUTANEOUS | 0 refills | Status: AC

## 2024-07-08 NOTE — Assessment & Plan Note (Signed)
 Lab Results  Component Value Date   CHOL 183 09/25/2023   HDL 49 09/25/2023   LDLCALC 118 (H) 09/25/2023   LDLDIRECT 118 (H) 02/26/2024   TRIG 89 09/25/2023   CHOLHDL 3.7 09/25/2023   Non-adherence to atorvastatin  likely contributing to elevated cholesterol levels. - Encouraged adherence to atorvastatin  20 mg daily. - Advised on dietary modifications to reduce cholesterol intake.

## 2024-07-08 NOTE — Patient Instructions (Signed)
 1. Controlled type 2 diabetes mellitus with hyperglycemia, without long-term current use of insulin (HCC) (Primary)  - POCT glycosylated hemoglobin (Hb A1C) - valsartan  (DIOVAN ) 80 MG tablet; Take 1 tablet (80 mg total) by mouth daily.  Dispense: 60 tablet; Refill: 1 - Semaglutide ,0.25 or 0.5MG /DOS, 2 MG/3ML SOPN; Inject 0.25 mg into the skin once a week.  Dispense: 3 mL; Refill: 0  2. Dyslipidemia, goal LDL below 70  - Lipid panel  3. Hypokalemia  - Potassium  4. Primary hypertension  - valsartan  (DIOVAN ) 80 MG tablet; Take 1 tablet (80 mg total) by mouth daily.  Dispense: 60 tablet; Refill: 1 - amLODipine  (NORVASC ) 5 MG tablet; Take 1 tablet (5 mg total) by mouth daily.  Dispense: 90 tablet; Refill: 1 - metoprolol  succinate (TOPROL -XL) 25 MG 24 hr tablet; Take 1 tablet (25 mg total) by mouth daily.  Dispense: 90 tablet; Refill: 1  5. Class 3 severe obesity due to excess calories with serious comorbidity and body mass index (BMI) of 50.0 to 59.9 in adult (HCC)  - Semaglutide ,0.25 or 0.5MG /DOS, 2 MG/3ML SOPN; Inject 0.25 mg into the skin once a week.  Dispense: 3 mL; Refill: 0  6. Tinea pedis of both feet  - clotrimazole  (LOTRIMIN ) 1 % cream; Apply 1 Application topically 2 (two) times daily.  Dispense: 30 g; Refill: 0   Apply to affected and surrounding area(s) twice daily until 1 week after clinical resolution, typically for 4 weeks total     It is important that you exercise regularly at least 30 minutes 5 times a week as tolerated  Think about what you will eat, plan ahead. Choose  clean, green, fresh or frozen over canned, processed or packaged foods which are more sugary, salty and fatty. 70 to 75% of food eaten should be vegetables and fruit. Three meals at set times with snacks allowed between meals, but they must be fruit or vegetables. Aim to eat over a 12 hour period , example 7 am to 7 pm, and STOP after  your last meal of the day. Drink water,generally about 64  ounces per day, no other drink is as healthy. Fruit juice is best enjoyed in a healthy way, by EATING the fruit.  Thanks for choosing [facility] we consider it a privelige to serve you.

## 2024-07-08 NOTE — Assessment & Plan Note (Signed)
 DASH diet and commitment to daily physical activity for a minimum of 30 minutes discussed and encouraged, as a part of hypertension management. The importance of attaining a healthy weight is also discussed. Hypertension Blood pressure elevated at 153/104 mmHg despite adherence to amlodipine  and metoprolol . Non-adherence to valsartan  noted. - Encouraged adherence to valsartan  80 mg daily., continue amlodipine  5mg  daily , metoprolol  25mg  daily      07/08/2024    1:28 PM 07/08/2024    1:24 PM 06/19/2024    9:35 PM 06/19/2024    8:06 PM 06/19/2024    8:03 PM 02/26/2024   10:46 AM 02/26/2024   10:41 AM  BP/Weight  Systolic BP 150 153 144 160  122 142  Diastolic BP 100 104 92 115  73 92  Wt. (Lbs)  339   330    BMI  56.41 kg/m2   54.91 kg/m2

## 2024-07-09 ENCOUNTER — Telehealth: Payer: Self-pay

## 2024-07-09 ENCOUNTER — Ambulatory Visit: Payer: Self-pay | Admitting: Nurse Practitioner

## 2024-07-09 ENCOUNTER — Other Ambulatory Visit: Payer: Self-pay

## 2024-07-09 LAB — POTASSIUM: Potassium: 4 mmol/L (ref 3.5–5.2)

## 2024-07-09 LAB — LIPID PANEL
Chol/HDL Ratio: 4.3 ratio (ref 0.0–4.4)
Cholesterol, Total: 188 mg/dL (ref 100–199)
HDL: 44 mg/dL
LDL Chol Calc (NIH): 127 mg/dL — ABNORMAL HIGH (ref 0–99)
Triglycerides: 92 mg/dL (ref 0–149)
VLDL Cholesterol Cal: 17 mg/dL (ref 5–40)

## 2024-07-09 NOTE — Telephone Encounter (Signed)
 Pharmacy Patient Advocate Encounter  Received notification from [facility] MEDICA[employer] that Prior Authorization for [id number]  has been APPROVED from 07/09/2024 to 07/09/2025

## 2024-07-15 ENCOUNTER — Encounter: Payer: Self-pay | Admitting: Obstetrics and Gynecology

## 2024-08-06 ENCOUNTER — Ambulatory Visit: Payer: Self-pay | Admitting: Nurse Practitioner
